# Patient Record
Sex: Male | Born: 1985 | Race: White | Hispanic: No | Marital: Single | Smoking: Never smoker
Health system: Southern US, Community
[De-identification: ages and names within clinical notes are randomized; demographics above are authoritative.]

## PROBLEM LIST (undated history)

## (undated) DIAGNOSIS — N200 Calculus of kidney: Secondary | ICD-10-CM

## (undated) DIAGNOSIS — F419 Anxiety disorder, unspecified: Secondary | ICD-10-CM

## (undated) DIAGNOSIS — F329 Major depressive disorder, single episode, unspecified: Secondary | ICD-10-CM

## (undated) DIAGNOSIS — F32A Depression, unspecified: Secondary | ICD-10-CM

---
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
## 2007-09-17 ENCOUNTER — Inpatient Hospital Stay (HOSPITAL_COMMUNITY): Admission: EM | Admit: 2007-09-17 | Discharge: 2007-09-20 | Payer: Self-pay | Admitting: *Deleted

## 2007-09-20 ENCOUNTER — Ambulatory Visit: Payer: Self-pay | Admitting: *Deleted

## 2008-08-21 ENCOUNTER — Emergency Department (HOSPITAL_COMMUNITY): Admission: EM | Admit: 2008-08-21 | Discharge: 2008-08-21 | Payer: Self-pay | Admitting: Emergency Medicine

## 2009-09-21 ENCOUNTER — Emergency Department (HOSPITAL_COMMUNITY): Admission: EM | Admit: 2009-09-21 | Discharge: 2009-09-21 | Payer: Self-pay | Admitting: Emergency Medicine

## 2010-02-06 ENCOUNTER — Ambulatory Visit: Payer: Self-pay | Admitting: Internal Medicine

## 2010-02-06 ENCOUNTER — Encounter (INDEPENDENT_AMBULATORY_CARE_PROVIDER_SITE_OTHER): Payer: Self-pay | Admitting: Family Medicine

## 2010-02-06 LAB — CONVERTED CEMR LAB
ALT: 64 units/L — ABNORMAL HIGH (ref 0–53)
AST: 26 units/L (ref 0–37)
Basophils Absolute: 0 10*3/uL (ref 0.0–0.1)
Basophils Relative: 1 % (ref 0–1)
CO2: 27 meq/L (ref 19–32)
Calcium: 10.1 mg/dL (ref 8.4–10.5)
Chloride: 102 meq/L (ref 96–112)
Cholesterol: 194 mg/dL (ref 0–200)
HCV Ab: NEGATIVE
Hemoglobin: 14.6 g/dL (ref 13.0–17.0)
Hep A Total Ab: NEGATIVE
Hep B Core Total Ab: NEGATIVE
Hep B S Ab: NEGATIVE
Lymphocytes Relative: 29 % (ref 12–46)
MCHC: 32.4 g/dL (ref 30.0–36.0)
Neutro Abs: 2.5 10*3/uL (ref 1.7–7.7)
Neutrophils Relative %: 60 % (ref 43–77)
Platelets: 221 10*3/uL (ref 150–400)
Potassium: 3.9 meq/L (ref 3.5–5.3)
RDW: 12.3 % (ref 11.5–15.5)
Sodium: 141 meq/L (ref 135–145)
Total CHOL/HDL Ratio: 5.2
Total Protein: 7.4 g/dL (ref 6.0–8.3)

## 2010-03-27 ENCOUNTER — Emergency Department (HOSPITAL_COMMUNITY): Admission: EM | Admit: 2010-03-27 | Discharge: 2010-03-27 | Payer: Self-pay | Admitting: Emergency Medicine

## 2010-09-05 LAB — URINALYSIS, ROUTINE W REFLEX MICROSCOPIC
Bilirubin Urine: NEGATIVE
Ketones, ur: NEGATIVE mg/dL
Nitrite: NEGATIVE
Specific Gravity, Urine: 1.018 (ref 1.005–1.030)
Urobilinogen, UA: 0.2 mg/dL (ref 0.0–1.0)
pH: 7 (ref 5.0–8.0)

## 2010-09-05 LAB — DIFFERENTIAL
Eosinophils Absolute: 0.3 10*3/uL (ref 0.0–0.7)
Eosinophils Relative: 4 % (ref 0–5)
Lymphocytes Relative: 19 % (ref 12–46)
Lymphs Abs: 1.1 10*3/uL (ref 0.7–4.0)
Monocytes Absolute: 0.3 10*3/uL (ref 0.1–1.0)
Monocytes Relative: 6 % (ref 3–12)

## 2010-09-05 LAB — COMPREHENSIVE METABOLIC PANEL
ALT: 19 U/L (ref 0–53)
AST: 19 U/L (ref 0–37)
Albumin: 4.1 g/dL (ref 3.5–5.2)
CO2: 28 mEq/L (ref 19–32)
Calcium: 9.1 mg/dL (ref 8.4–10.5)
Creatinine, Ser: 0.91 mg/dL (ref 0.4–1.5)
GFR calc Af Amer: >60 mL/min (ref >60)
Sodium: 142 mEq/L (ref 135–145)
Total Protein: 6.6 g/dL (ref 6.0–8.3)

## 2010-09-05 LAB — CBC
Hemoglobin: 14 g/dL (ref 13.0–17.0)
MCH: 31.1 pg (ref 26.0–34.0)
MCHC: 33.6 g/dL (ref 30.0–36.0)
Platelets: 198 10*3/uL (ref 150–400)
RBC: 4.51 MIL/uL (ref 4.22–5.81)

## 2010-09-08 ENCOUNTER — Emergency Department (HOSPITAL_COMMUNITY)
Admission: EM | Admit: 2010-09-08 | Discharge: 2010-09-08 | Disposition: A | Discharge status: Home / Self Care | Payer: Self-pay | Attending: Emergency Medicine | Admitting: Emergency Medicine

## 2010-09-08 DIAGNOSIS — R109 Unspecified abdominal pain: Secondary | ICD-10-CM | POA: Insufficient documentation

## 2010-09-08 DIAGNOSIS — N201 Calculus of ureter: Secondary | ICD-10-CM | POA: Insufficient documentation

## 2010-09-08 DIAGNOSIS — Z87442 Personal history of urinary calculi: Secondary | ICD-10-CM | POA: Insufficient documentation

## 2010-09-08 LAB — URINALYSIS, ROUTINE W REFLEX MICROSCOPIC
Bilirubin Urine: NEGATIVE
Glucose, UA: NEGATIVE mg/dL
Ketones, ur: 15 mg/dL — AB
Leukocytes, UA: NEGATIVE
Protein, ur: NEGATIVE mg/dL
pH: 6.5 (ref 5.0–8.0)

## 2010-09-08 LAB — URINE MICROSCOPIC-ADD ON

## 2010-09-11 LAB — POCT I-STAT, CHEM 8
BUN: 11 mg/dL (ref 6–23)
Calcium, Ion: 1.14 mmol/L (ref 1.12–1.32)
Chloride: 106 mEq/L (ref 96–112)
Creatinine, Ser: 1 mg/dL (ref 0.4–1.5)
TCO2: 27 mmol/L (ref 0–100)

## 2010-09-11 LAB — URINALYSIS, ROUTINE W REFLEX MICROSCOPIC
Bilirubin Urine: NEGATIVE
Ketones, ur: NEGATIVE mg/dL
Leukocytes, UA: NEGATIVE
Nitrite: NEGATIVE
Protein, ur: NEGATIVE mg/dL
Urobilinogen, UA: 1 mg/dL (ref 0.0–1.0)

## 2010-09-14 ENCOUNTER — Emergency Department (HOSPITAL_COMMUNITY)
Admission: EM | Admit: 2010-09-14 | Discharge: 2010-09-14 | Disposition: A | Discharge status: Home / Self Care | Payer: Self-pay | Attending: Emergency Medicine | Admitting: Emergency Medicine

## 2010-09-14 ENCOUNTER — Emergency Department (HOSPITAL_COMMUNITY): Payer: Self-pay

## 2010-09-14 DIAGNOSIS — R109 Unspecified abdominal pain: Secondary | ICD-10-CM | POA: Insufficient documentation

## 2010-09-14 DIAGNOSIS — Z87442 Personal history of urinary calculi: Secondary | ICD-10-CM | POA: Insufficient documentation

## 2010-09-14 LAB — URINALYSIS, ROUTINE W REFLEX MICROSCOPIC
Glucose, UA: NEGATIVE mg/dL
Hgb urine dipstick: NEGATIVE
Ketones, ur: NEGATIVE mg/dL
Protein, ur: NEGATIVE mg/dL
pH: 6 (ref 5.0–8.0)

## 2010-09-14 LAB — POCT I-STAT, CHEM 8
BUN: 21 mg/dL (ref 6–23)
Calcium, Ion: 1.17 mmol/L (ref 1.12–1.32)
Chloride: 102 mEq/L (ref 96–112)
Creatinine, Ser: 1 mg/dL (ref 0.4–1.5)
Sodium: 138 mEq/L (ref 135–145)
TCO2: 24 mmol/L (ref 0–100)

## 2010-09-21 ENCOUNTER — Emergency Department (HOSPITAL_COMMUNITY)
Admission: EM | Admit: 2010-09-21 | Discharge: 2010-09-21 | Disposition: A | Discharge status: Home / Self Care | Payer: Self-pay | Attending: Emergency Medicine | Admitting: Emergency Medicine

## 2010-09-21 DIAGNOSIS — M545 Low back pain, unspecified: Secondary | ICD-10-CM | POA: Insufficient documentation

## 2010-09-21 DIAGNOSIS — Z87442 Personal history of urinary calculi: Secondary | ICD-10-CM | POA: Insufficient documentation

## 2010-09-21 DIAGNOSIS — R319 Hematuria, unspecified: Secondary | ICD-10-CM | POA: Insufficient documentation

## 2010-09-21 LAB — URINALYSIS, ROUTINE W REFLEX MICROSCOPIC
Glucose, UA: NEGATIVE mg/dL
Leukocytes, UA: NEGATIVE
pH: 7.5 (ref 5.0–8.0)

## 2010-09-21 LAB — URINE MICROSCOPIC-ADD ON

## 2010-09-23 LAB — URINE CULTURE

## 2010-09-24 ENCOUNTER — Emergency Department (HOSPITAL_COMMUNITY): Payer: Self-pay

## 2010-09-24 ENCOUNTER — Emergency Department (HOSPITAL_COMMUNITY)
Admission: EM | Admit: 2010-09-24 | Discharge: 2010-09-24 | Disposition: A | Discharge status: Home / Self Care | Payer: Self-pay | Attending: Emergency Medicine | Admitting: Emergency Medicine

## 2010-09-24 DIAGNOSIS — R109 Unspecified abdominal pain: Secondary | ICD-10-CM | POA: Insufficient documentation

## 2010-09-24 DIAGNOSIS — N200 Calculus of kidney: Secondary | ICD-10-CM | POA: Insufficient documentation

## 2010-09-24 DIAGNOSIS — R319 Hematuria, unspecified: Secondary | ICD-10-CM | POA: Insufficient documentation

## 2010-09-24 DIAGNOSIS — N509 Disorder of male genital organs, unspecified: Secondary | ICD-10-CM | POA: Insufficient documentation

## 2010-09-24 LAB — BASIC METABOLIC PANEL
Chloride: 103 mEq/L (ref 96–112)
GFR calc non Af Amer: >60 mL/min (ref >60)
Glucose, Bld: 92 mg/dL (ref 70–99)
Potassium: 3.5 mEq/L (ref 3.5–5.1)
Sodium: 140 mEq/L (ref 135–145)

## 2010-09-24 LAB — URINALYSIS, ROUTINE W REFLEX MICROSCOPIC
Bilirubin Urine: NEGATIVE
Glucose, UA: NEGATIVE mg/dL
Specific Gravity, Urine: 1.026 (ref 1.005–1.030)
Urobilinogen, UA: 0.2 mg/dL (ref 0.0–1.0)
pH: 6 (ref 5.0–8.0)

## 2010-09-24 LAB — DIFFERENTIAL
Basophils Absolute: 0 10*3/uL (ref 0.0–0.1)
Eosinophils Relative: 3 % (ref 0–5)
Lymphocytes Relative: 30 % (ref 12–46)
Neutro Abs: 3 10*3/uL (ref 1.7–7.7)
Neutrophils Relative %: 59 % (ref 43–77)

## 2010-09-24 LAB — URINE MICROSCOPIC-ADD ON

## 2010-09-24 LAB — CBC
HCT: 41.2 % (ref 39.0–52.0)
RBC: 4.57 MIL/uL (ref 4.22–5.81)
RDW: 11.3 % — ABNORMAL LOW (ref 11.5–15.5)
WBC: 5 10*3/uL (ref 4.0–10.5)

## 2010-11-05 NOTE — H&P (Signed)
NAME:  Ethan Peck, Ethan Peck NO.:  [id number]   MEDICAL RECORD NO.:  [id number]          PATIENT TYPE:  IPS   LOCATION:  0301                          FACILITY:  [facility]   PHYSICIAN:  Jasmine Pang, M.D. DATE OF BIRTH:  1985-07-18   DATE OF ADMISSION:  09/17/2007  DATE OF DISCHARGE:                       PSYCHIATRIC ADMISSION ASSESSMENT   IDENTIFYING INFORMATION/JUSTIFICATION FOR ADMISSION AND CARE:  This is a  25 year old single white male. He presented as a walk-in yesterday. He  stated that he was suicidal and depressed. He was actually brought to  the [facility] by his mother. He reported that he has been  having persistent suicidal ideation with a plan to overdose on alcohol  and pills or to shoot himself. He states he no longer wants to live and  he does not care if his death upsets his family. He reports multiple  stressors but primarily reports being harassed for being gay and having  no job and has financial issues. He was unable to contact for safety to  go home.   PAST PSYCHIATRIC HISTORY:  He states that he has attempted to start  counseling several times but it just has not worked out with anyone yet.  He denies any prior anti-depressant, anti-anxiety medications, although  he reports as a youngster, having been treated for ADD with Ritalin or  Adderall. He does not recall which exactly.   SOCIAL HISTORY:  He did finish high school. He lives with his mother. He  helps take care of his younger brother, who is in high school. The last  time he worked was approximately a year ago. 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   FAMILY HISTORY:  Denies alcohol and drug history.   SOCIAL HISTORY:  He denies any current usage, although he has used  marijuana in the past.   PRIMARY CARE PHYSICIAN:  Donia Guiles, M.D.   PAST MEDICAL HISTORY:  In the past 4 months, he has lost approximately  50 pounds. This was due to running and  exercising. He does not deny that  this was an intentional weight loss.   MEDICATIONS:  He is not on any.   ALLERGIES:  NO KNOWN DRUG ALLERGIES.   PHYSICAL EXAMINATION:  GENERAL:  A well developed, well nourished male  who appears his stated age of 33.  VITAL SIGNS:  Height 68 3/4 inches tall. Weight 157. Temperature 98.3.  Blood pressure 137/90 to 142/92. Pulse 54 to 78. Respiratory rate 16.  SKIN:  He has some stretch marks on his abdomen, which are consistent  with his weight loss. Other than that, he had no remarkable findings.   LABORATORY DATA:  Is being gotten. We have no results at this time.   MENTAL STATUS EXAM:  Alert and oriented. He was appropriately groomed,  dressed, and nourished. His speech is not pressured. His mood is  depressed. Affect is somewhat depressed as well. Thought processes are  clear, rational and goal oriented. He wants to get better. He realizes  that he needs to start medication and although he does  not want to be  around people, he was active in the male unit before we went to speak to  him. Judgment and insight are good. Concentration and memory  superficially at least, are intact, although he does report some issues  with it. Intelligence is at least average. He is still suicidal. He can  contract for safety while in the hospital. He does not have homicidal  ideation and he does not have auditory visual hallucinations.   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 He reports a prior history of ADD when younger and he also  reports having a              homosexual orientation.  AXIS II:    Deferred.  AXIS III:   None.  AXIS IV:    Severe. Financial, economic, and occupational issues.  AXIS V:     35.   PLAN:  1. Admit for safety and stabilization.  2. Will start medication toward that end, as he is self-pay and is not      under sponsorship. We started Celexa 10 mg a.m. and at bedtime. We      will get him  followup at [facility] for      medications and therapy.   ESTIMATED LENGTH OF STAY:  3 to 5 days.      Mickie Leonarda Salon, P.A.-C.      Jasmine Pang, M.D.  Electronically Signed    MD/MEDQ  D:  09/18/2007  T:  09/18/2007  Job:  [id number]

## 2010-11-08 NOTE — Discharge Summary (Signed)
NAME:  Ethan Peck, Ethan Peck NO.:  [id number]   MEDICAL RECORD NO.:  [id number]          PATIENT TYPE:  IPS   LOCATION:  0301                          FACILITY:  [facility]   PHYSICIAN:  Jasmine Pang, M.D. DATE OF BIRTH:  [birth year]   DATE OF ADMISSION:  09/17/2007  DATE OF DISCHARGE:  09/20/2007                               DISCHARGE SUMMARY   IDENTIFYING INFORMATION:  This is a 25 year old single white male  presented as a walk-in and was admitted on a voluntary basis on September 17, 2007.   HISTORY OF PRESENT ILLNESS:  He stated that he was suicidal and  depressed.  He was actually brought to [facility] by his  mother.  He reported that he had been having persistent suicidal  ideation with a plan to overdose on alcohol and pills or to shoot  himself.  He states he no longer wants to live and he does not care of  his death of sepsis family.  He reports multiple stressors, but  primarily reports being harassed for being gay and having no job and has  financial issues.  He is unable to contract for safety to go home.   PAST PSYCHIATRIC HISTORY:  The patient states he has attempted to start  counseling several times, but it is not worked out with anyone yet.  He  denies any prior antidepressant, antianxiety medications so he reports  as a youngster having been treated for ADD with Ritalin and Adderall.  He does not recall which exactly.   FAMILY HISTORY:  Denies any history.   ALCOHOL AND DRUG USE:  The patient denies any alcohol or drug use  history.   PAST MEDICAL HISTORY:  None.   MEDICATIONS:  He is not on any.   ALLERGIES:  No known drug allergies.   PHYSICAL EXAM:  The patient was a well-developed, well-nourished male  who appeared his stated age of 3, who was in no acute distress.  There  were no medical or physical problems.   HOSPITAL COURSE.:  Upon admission, the patient was placed on Ambien 10  mg p.o. q.h.s. p.r.n. insomnia.  He was  also started on Celexa 10 mg in  the morning and at bedtime.  The patient tolerated this medication well  with no significant side effects.  In individual sessions with me, he  was friendly and cooperative, fair eye contact.  He stated his suicidal  ideation had worsened and he had begun develop a plan to kill himself.  He reports a lot of depression and anxiety.  He states he lives with his  mother.  He was tolerating the Celexa well.  He participated  appropriately in unit therapeutic groups and activities.  As  hospitalization progressed, he became less depressed and less anxious.  His family came to visit and they were very supportive.  He felt  encouraged by this.  He discussed being picked on because he is gay.  He  continued had no side effects to the Celexa.  On September 20, 2007, mental  status had improved markedly from admission status.  Sleep was good.  Appetite was good.  Mood was less depressed, less anxious.  Affect  consistent with mood.  There was no suicidal or homicidal ideation.  No  thoughts of self-injurious behavior.  No auditory or visual  hallucinations.  No paranoia or delusions.  Thoughts were logical and  goal-directed.  Thought content, no predominant theme.  Cognitive was  grossly back to baseline.  The patient felt ready for discharge and his  family felt ready to have him home.   DISCHARGE DIAGNOSES:  Axis I:  Major depressive disorder, single, severe  without psychotic features.  Attention deficit hyperactivity disorder,  not otherwise specified.  Axis II:  None.  Axis III:  None.  Axis IV:  Severe (financial, economic, and occupational issues, burden  of psychiatric illness, psychosocial problems).  Axis V:  Global assessment of functioning was 50 upon discharge.  GAF  was 35 upon admission.  GAF highest past year was 60-65.   DISCHARGE PLANS:  There was no specific activity level or dietary  restrictions.   POSTHOSPITAL CARE PLANS:  [name] by Dr. Lang Snow on April 16 at 3:15 p.m..   DISCHARGE MEDICATIONS:  1. Celexa 20 mg daily.  2. Ambien 10 mg at bedtime.      Jasmine Pang, M.D.  Electronically Signed     BHS/MEDQ  D:  10/18/2007  T:  10/18/2007  Job:  [id number]

## 2011-03-17 LAB — DRUGS OF ABUSE SCREEN W/O ALC, ROUTINE URINE
Amphetamine Screen, Ur: NEGATIVE
Barbiturate Quant, Ur: NEGATIVE
Creatinine,U: 136.3
Methadone: NEGATIVE
Phencyclidine (PCP): NEGATIVE

## 2011-03-17 LAB — TSH: TSH: 1.134

## 2011-03-17 LAB — COMPREHENSIVE METABOLIC PANEL
Alkaline Phosphatase: 53
BUN: 10
Chloride: 103
Glucose, Bld: 103 — ABNORMAL HIGH
Potassium: 4.1
Total Bilirubin: 1

## 2011-03-17 LAB — URINALYSIS, ROUTINE W REFLEX MICROSCOPIC
Bilirubin Urine: NEGATIVE
Glucose, UA: NEGATIVE
Ketones, ur: NEGATIVE
pH: 6.5

## 2011-03-17 LAB — CBC
HCT: 44.5
Hemoglobin: 15.7
MCV: 90.1
RDW: 12.3
WBC: 7.1

## 2011-03-17 LAB — HIV ANTIBODY (ROUTINE TESTING W REFLEX): HIV: NONREACTIVE

## 2011-03-17 LAB — BENZODIAZEPINE, QUANTITATIVE, URINE: Alprazolam (GC/LC/MS), ur confirm: 170 ng/mL

## 2011-09-13 ENCOUNTER — Encounter (HOSPITAL_COMMUNITY): Payer: Self-pay | Admitting: Emergency Medicine

## 2011-09-13 ENCOUNTER — Emergency Department (HOSPITAL_COMMUNITY): Payer: Self-pay

## 2011-09-13 ENCOUNTER — Emergency Department (HOSPITAL_COMMUNITY)
Admission: EM | Admit: 2011-09-13 | Discharge: 2011-09-13 | Disposition: A | Discharge status: Home / Self Care | Payer: Self-pay | Attending: Emergency Medicine | Admitting: Emergency Medicine

## 2011-09-13 DIAGNOSIS — R109 Unspecified abdominal pain: Secondary | ICD-10-CM | POA: Insufficient documentation

## 2011-09-13 DIAGNOSIS — N390 Urinary tract infection, site not specified: Secondary | ICD-10-CM | POA: Insufficient documentation

## 2011-09-13 DIAGNOSIS — R112 Nausea with vomiting, unspecified: Secondary | ICD-10-CM | POA: Insufficient documentation

## 2011-09-13 DIAGNOSIS — N200 Calculus of kidney: Secondary | ICD-10-CM | POA: Insufficient documentation

## 2011-09-13 HISTORY — DX: Calculus of kidney: N20.0

## 2011-09-13 LAB — BASIC METABOLIC PANEL
CO2: 27 mEq/L (ref 19–32)
Calcium: 9.4 mg/dL (ref 8.4–10.5)
Creatinine, Ser: 0.79 mg/dL (ref 0.50–1.35)
GFR calc Af Amer: >90 mL/min (ref >90)
Sodium: 141 mEq/L (ref 135–145)

## 2011-09-13 LAB — URINALYSIS, ROUTINE W REFLEX MICROSCOPIC
Glucose, UA: NEGATIVE mg/dL
Nitrite: NEGATIVE
Protein, ur: NEGATIVE mg/dL
pH: 6.5 (ref 5.0–8.0)

## 2011-09-13 LAB — URINE MICROSCOPIC-ADD ON

## 2011-09-13 MED ORDER — MORPHINE SULFATE 4 MG/ML IJ SOLN
4.0000 mg | Freq: Once | INTRAMUSCULAR | Status: AC
  Administered 2011-09-13: 4 mg via INTRAVENOUS
  Filled 2011-09-13: qty 1

## 2011-09-13 MED ORDER — ONDANSETRON 8 MG PO TBDP: 8.0000 mg | ORAL_TABLET | Freq: Three times a day (TID) | ORAL | Status: AC | PRN

## 2011-09-13 MED ORDER — TAMSULOSIN HCL 0.4 MG PO CAPS: 0.4000 mg | ORAL_CAPSULE | Freq: Every day | ORAL | Status: DC

## 2011-09-13 MED ORDER — OXYCODONE-ACETAMINOPHEN 5-325 MG PO TABS: 2.0000 | ORAL_TABLET | Freq: Four times a day (QID) | ORAL | Status: AC | PRN

## 2011-09-13 MED ORDER — CEPHALEXIN 250 MG PO CAPS
500.0000 mg | ORAL_CAPSULE | Freq: Once | ORAL | Status: AC
  Administered 2011-09-13: 500 mg via ORAL
  Filled 2011-09-13: qty 2

## 2011-09-13 MED ORDER — ONDANSETRON HCL 4 MG/2ML IJ SOLN
4.0000 mg | Freq: Once | INTRAMUSCULAR | Status: AC
  Administered 2011-09-13: 4 mg via INTRAVENOUS
  Filled 2011-09-13: qty 2

## 2011-09-13 MED ORDER — CEPHALEXIN 500 MG PO CAPS: 500.0000 mg | ORAL_CAPSULE | Freq: Four times a day (QID) | ORAL | Status: AC

## 2011-09-13 MED ORDER — SODIUM CHLORIDE 0.9 % IV BOLUS (SEPSIS)
1000.0000 mL | Freq: Once | INTRAVENOUS | Status: AC
  Administered 2011-09-13: 1000 mL via INTRAVENOUS

## 2011-09-13 NOTE — Discharge Instructions (Signed)
Kidney Stones  Kidney stones (ureteral lithiasis) are deposits that form inside your kidneys. The intense pain is caused by the stone moving through the urinary tract. When the stone moves, the ureter goes into spasm around the stone. The stone is usually passed in the urine.   CAUSES    A disorder that makes certain neck glands produce too much parathyroid hormone (primary hyperparathyroidism).   A buildup of uric acid crystals.   Narrowing (stricture) of the ureter.   A kidney obstruction present at birth (congenital obstruction).   Previous surgery on the kidney or ureters.   Numerous kidney infections.  SYMPTOMS    Feeling sick to your stomach (nauseous).   Throwing up (vomiting).   Blood in the urine (hematuria).   Pain that usually spreads (radiates) to the groin.   Frequency or urgency of urination.  DIAGNOSIS    Taking a history and physical exam.   Blood or urine tests.   Computerized X-ray scan (CT scan).   Occasionally, an examination of the inside of the urinary bladder (cystoscopy) is performed.  TREATMENT    Observation.   Increasing your fluid intake.   Surgery may be needed if you have severe pain or persistent obstruction.  The size, location, and chemical composition are all important variables that will determine the proper choice of action for you. Talk to your caregiver to better understand your situation so that you will minimize the risk of injury to yourself and your kidney.   HOME CARE INSTRUCTIONS    Drink enough water and fluids to keep your urine clear or pale yellow.   Strain all urine through the provided strainer. Keep all particulate matter and stones for your caregiver to see. The stone causing the pain may be as small as a grain of salt. It is very important to use the strainer each and every time you pass your urine. The collection of your stone will allow your caregiver to analyze it and verify that a stone has actually passed.   Only take over-the-counter or  prescription medicines for pain, discomfort, or fever as directed by your caregiver.   Make a follow-up appointment with your caregiver as directed.   Get follow-up X-rays if required. The absence of pain does not always mean that the stone has passed. It may have only stopped moving. If the urine remains completely obstructed, it can cause loss of kidney function or even complete destruction of the kidney. It is your responsibility to make sure X-rays and follow-ups are completed. Ultrasounds of the kidney can show blockages and the status of the kidney. Ultrasounds are not associated with any radiation and can be performed easily in a matter of minutes.  SEEK IMMEDIATE MEDICAL CARE IF:    Pain cannot be controlled with the prescribed medicine.   You have a fever.   The severity or intensity of pain increases over 18 hours and is not relieved by pain medicine.   You develop a new onset of abdominal pain.   You feel faint or pass out.  MAKE SURE YOU:    Understand these instructions.   Will watch your condition.   Will get help right away if you are not doing well or get worse.  Document Released: 06/09/2005 Document Revised: 05/29/2011 Document Reviewed: 10/05/2009  ExitCare Patient Information 2012 [employer].    Urinary Tract Infection  Infections of the urinary tract can start in several places. A bladder infection (cystitis), a kidney infection (pyelonephritis), and a   prostate infection (prostatitis) are different types of urinary tract infections (UTIs). They usually get better if treated with medicines (antibiotics) that kill germs. Take all the medicine until it is gone. You or your child may feel better in a few days, but TAKE ALL MEDICINE or the infection may not respond and may become more difficult to treat.  HOME CARE INSTRUCTIONS    Drink enough water and fluids to keep the urine clear or pale yellow. Cranberry juice is especially recommended, in addition to large amounts of  water.   Avoid caffeine, tea, and carbonated beverages. They tend to irritate the bladder.   Alcohol may irritate the prostate.   Only take over-the-counter or prescription medicines for pain, discomfort, or fever as directed by your caregiver.  To prevent further infections:   Empty the bladder often. Avoid holding urine for long periods of time.   After a bowel movement, women should cleanse from front to back. Use each tissue only once.   Empty the bladder before and after sexual intercourse.  FINDING OUT THE RESULTS OF YOUR TEST  Not all test results are available during your visit. If your or your child's test results are not back during the visit, make an appointment with your caregiver to find out the results. Do not assume everything is normal if you have not heard from your caregiver or the medical facility. It is important for you to follow up on all test results.  SEEK MEDICAL CARE IF:    There is back pain.   Your baby is older than 3 months with a rectal temperature of 100.5 F (38.1 C) or higher for more than 1 day.   Your or your child's problems (symptoms) are no better in 3 days. Return sooner if you or your child is getting worse.  SEEK IMMEDIATE MEDICAL CARE IF:    There is severe back pain or lower abdominal pain.   You or your child develops chills.   You have a fever.   Your baby is older than 3 months with a rectal temperature of 102 F (38.9 C) or higher.   Your baby is 3 months old or younger with a rectal temperature of 100.4 F (38 C) or higher.   There is nausea or vomiting.   There is continued burning or discomfort with urination.  MAKE SURE YOU:    Understand these instructions.   Will watch your condition.   Will get help right away if you are not doing well or get worse.  Document Released: 03/19/2005 Document Revised: 05/29/2011 Document Reviewed: 10/22/2006  [employer] Patient Information 2012 [employer].

## 2011-09-13 NOTE — ED Provider Notes (Signed)
History     CSN: [id number]  Arrival date & time 09/13/11  1530   First MD Initiated Contact with Patient 09/13/11 1633      Chief Complaint  Patient presents with  . Flank Pain    (Consider location/radiation/quality/duration/timing/severity/associated sxs/prior treatment) HPI Patient is a 26 year old male who presents today complaining of having stabbing pain in his left flank and radiating down to his left lower quadrant that is similar to prior kidney stones. This began about 4 days ago. He's had some nausea and vomiting. Patient denies fevers. He denies any blood in his urine at this time. Patient has had nausea and one episode of vomiting. Patient has never required surgical intervention for kidney stones. He denies any penile discharge or penile pain. Patient denies any testicular pain as well. There are no other associated or modifying factors.  Past Medical History  Diagnosis Date  . Kidney stone     History reviewed. No pertinent past surgical history.  History reviewed. No pertinent family history.  History  Substance Use Topics  . Smoking status: Never Smoker   . Smokeless tobacco: Not on file  . Alcohol Use: Yes     on occasion      Review of Systems  Constitutional: Negative.   HENT: Negative.   Eyes: Negative.   Respiratory: Negative.   Cardiovascular: Negative.   Gastrointestinal: Positive for nausea, vomiting and abdominal pain.  Genitourinary: Positive for flank pain and difficulty urinating. Negative for dysuria, urgency, hematuria, discharge, penile swelling, scrotal swelling, penile pain and testicular pain.  Musculoskeletal: Negative.   Skin: Negative.   Neurological: Negative.   Hematological: Negative.   Psychiatric/Behavioral: Negative.   All other systems reviewed and are negative.    Allergies  Review of patient's allergies indicates no known allergies.  Home Medications   Current Outpatient Rx  Name Route Sig Dispense Refill  .  CEPHALEXIN 500 MG PO CAPS Oral Take 1 capsule (500 mg total) by mouth 4 (four) times daily. 40 capsule 0  . ONDANSETRON 8 MG PO TBDP Oral Take 1 tablet (8 mg total) by mouth every 8 (eight) hours as needed for nausea. 20 tablet 0  . OXYCODONE-ACETAMINOPHEN 5-325 MG PO TABS Oral Take 2 tablets by mouth every 6 (six) hours as needed for pain. 30 tablet 0  . TAMSULOSIN HCL 0.4 MG PO CAPS Oral Take 1 capsule (0.4 mg total) by mouth daily. 30 capsule 0    BP 118/64  Pulse 60  Temp(Src) 98.1 F (36.7 C) (Oral)  Resp 20  SpO2 98%  Physical Exam  Nursing note and vitals reviewed. GEN: Well-developed, well-nourished male in no distress, uncomfortable appearing HEENT: Atraumatic, normocephalic. Oropharynx clear without erythema EYES: PERRLA BL, no scleral icterus. NECK: Trachea midline, no meningismus CV: regular rate and rhythm. No murmurs, rubs, or gallops PULM: No respiratory distress.  No crackles, wheezes, or rales. GI: soft, tender to palpation over the left lower quadrant. No guarding, rebound. + bowel sounds  GU: deferred Neuro: cranial nerves 2-12 intact, no abnormalities of strength or sensation, A and O x 3 MSK: Patient moves all 4 extremities symmetrically, no deformity, edema, or injury noted Skin: No rashes petechiae, purpura, or jaundice Psych: no abnormality of mood   ED Course  Procedures (including critical care time)  Labs Reviewed  URINALYSIS, ROUTINE W REFLEX MICROSCOPIC - Abnormal; Notable for the following:    APPearance CLOUDY (*)    Leukocytes, UA LARGE (*)    All other components within  normal limits  BASIC METABOLIC PANEL - Abnormal; Notable for the following:    Potassium 3.4 (*)    All other components within normal limits  URINE MICROSCOPIC-ADD ON  URINE CULTURE   Ct Abdomen Pelvis Wo Contrast  09/13/2011  *RADIOLOGY REPORT*  Clinical Data: Left flank and groin pain beginning 2 days ago. History urinary tract stone.  CT ABDOMEN AND PELVIS WITHOUT  CONTRAST  Technique:  Multidetector CT imaging of the abdomen and pelvis was performed following the standard protocol without intravenous contrast.  Comparison: CT abdomen pelvis 09/24/2010.  Findings: There is an unchanged 1-2 mm subpleural nodule along the minor fissure.  Dependent atelectasis is noted.  No pleural or pericardial effusion.  There are no ureteral stones or hydronephrosis.  A punctate nonobstructing stone is identified in the mid pole of the right kidney. There is haziness of fat in the root of the mesentery where multiple small lymph nodes are identified.  Finding was present on the prior examination and does not appear notably changed.  The gallbladder, liver, spleen, adrenal glands and pancreas appear normal.  There is no fluid collection.  Urinary bladder, seminal vesicles and prostate gland are unremarkable.  The stomach, small and large bowel and appendix appear normal.  There is no focal bony abnormality.  IMPRESSION:  1.  Single punctate nonobstructing stone is identified in the right kidney.  There are no other urinary tract stones and no hydronephrosis. 2.  Haziness of the root of the mesentery where multiple small lymph nodes are identified does not appear markedly changed. Finding may be due to mesenteric adenitis.  Original Report Authenticated By: Bernadene Bell. D'ALESSIO, M.D.     1. Nephrolithiasis   2. UTI (urinary tract infection)       MDM  Patient was evaluated by myself. Based on presentation patient had a basic metabolic panel, urinalysis, and CT of abdomen and pelvis without contrast ordered. Urinalysis was remarkable for elevated white blood cells. Patient was nitrate negative. A urine culture was sent. Patient had no compromise of his renal function on metabolic panel. CT did show a punctate stone on the right but nothing on the left. Patient has no prior history of urinary tract infection. Patient had discussed the possibility of urethritis from a sexual transmitted  disease. Patient has had protected sex every time per his report he denies any penile discharge or penile pain. He has no scrotal pain patient declined testing for STI today. Patient also preferred to be treated for UTI initially. I recommended followup with a urologist. I do think patient also has symptoms from the past of a kidney stone given that he has always had very small stones in the past. Though this seems less likely. Patient was discharged with prescription for Flomax, Percocet, Zofran, and Keflex. He was given his first dose of Keflex in the emergency department. Patient was able to tolerate by mouth and was discharged home in good condition.        Cyndra Numbers, MD 09/13/11 [phone]

## 2011-09-13 NOTE — ED Notes (Addendum)
Per pt started having pain 2 days ago described as stabbing, in L hip to groin area and L flank, reports "feels like passing kidney stone" d/t history of kidney and reports this pain is similar to pain he has had before with stones; pt denies blood in urine, reports dysuria, and difficulty starting to urinate; pt a&OX4 and in NAD

## 2011-09-14 LAB — URINE CULTURE: Culture  Setup Time: 201303240151

## 2012-01-12 ENCOUNTER — Emergency Department (HOSPITAL_COMMUNITY)
Admission: EM | Admit: 2012-01-12 | Discharge: 2012-01-13 | Disposition: A | Discharge status: Home Health | Payer: Self-pay | Attending: Emergency Medicine | Admitting: Emergency Medicine

## 2012-01-12 DIAGNOSIS — M549 Dorsalgia, unspecified: Secondary | ICD-10-CM | POA: Insufficient documentation

## 2012-01-12 DIAGNOSIS — R109 Unspecified abdominal pain: Secondary | ICD-10-CM | POA: Insufficient documentation

## 2012-01-12 NOTE — ED Notes (Signed)
Pt reports hx kidney stones.  Pt report pain started a week ago and began toworsen to right side abd and radiating to right lower back.  Pt reports n/v.  Pt denies chills and fever.

## 2012-01-13 LAB — CBC WITH DIFFERENTIAL/PLATELET
Basophils Absolute: 0 10*3/uL (ref 0.0–0.1)
Eosinophils Absolute: 0.3 10*3/uL (ref 0.0–0.7)
Eosinophils Relative: 4 % (ref 0–5)
MCH: 30.1 pg (ref 26.0–34.0)
MCV: 89.9 fL (ref 78.0–100.0)
Platelets: 217 10*3/uL (ref 150–400)
RDW: 12.4 % (ref 11.5–15.5)

## 2012-01-13 LAB — URINALYSIS, ROUTINE W REFLEX MICROSCOPIC
Bilirubin Urine: NEGATIVE
Hgb urine dipstick: NEGATIVE
Protein, ur: NEGATIVE mg/dL
Urobilinogen, UA: 1 mg/dL (ref 0.0–1.0)

## 2012-01-13 LAB — BASIC METABOLIC PANEL
BUN: 14 mg/dL (ref 6–23)
Chloride: 106 mEq/L (ref 96–112)
Creatinine, Ser: 1.14 mg/dL (ref 0.50–1.35)
GFR calc Af Amer: >90 mL/min (ref >90)
GFR calc non Af Amer: 89 mL/min — ABNORMAL LOW (ref >90)

## 2012-01-13 MED ORDER — DICYCLOMINE HCL 10 MG PO CAPS
10.0000 mg | ORAL_CAPSULE | Freq: Once | ORAL | Status: AC
  Administered 2012-01-13: 10 mg via ORAL
  Filled 2012-01-13: qty 1

## 2012-01-13 MED ORDER — SODIUM CHLORIDE 0.9 % IV BOLUS (SEPSIS)
1000.0000 mL | Freq: Once | INTRAVENOUS | Status: AC
  Administered 2012-01-13: 1000 mL via INTRAVENOUS

## 2012-01-13 MED ORDER — KETOROLAC TROMETHAMINE 30 MG/ML IJ SOLN
30.0000 mg | Freq: Once | INTRAMUSCULAR | Status: AC
  Administered 2012-01-13: 30 mg via INTRAVENOUS
  Filled 2012-01-13: qty 1

## 2012-01-13 MED ORDER — ONDANSETRON HCL 4 MG/2ML IJ SOLN
4.0000 mg | Freq: Once | INTRAMUSCULAR | Status: AC
  Administered 2012-01-13: 4 mg via INTRAVENOUS
  Filled 2012-01-13: qty 2

## 2012-01-13 MED ORDER — DICYCLOMINE HCL 20 MG PO TABS: 20.0000 mg | ORAL_TABLET | Freq: Four times a day (QID) | ORAL | Status: DC | PRN

## 2012-01-13 NOTE — ED Notes (Signed)
Pt with HR of 40. MD informed. Pt stated to walk pt around unit. Pt ambulated around unit. Pt w/o any s/s of distress or pain. Pt tolerated well. Pt with HR of 46. MD informed. Will continue to monitor.

## 2012-01-13 NOTE — ED Notes (Signed)
Pt instructed needs to void.

## 2012-01-13 NOTE — ED Provider Notes (Signed)
History     CSN: [id number]  Arrival date & time 01/12/12  2341   First MD Initiated Contact with Patient 01/13/12 0018      Chief Complaint  Patient presents with  . Abdominal Pain  . Back Pain    (Consider location/radiation/quality/duration/timing/severity/associated sxs/prior treatment) HPI 26 year old male presents to emergency room complaining of one week of ongoing suprapubic pain with radiation into bilateral flanks. Patient feels like he is having another kidney stone. Patient reports history of kidney stone in March. No fever no chills, no nausea or vomiting.  No penile discharge or dysuria.  Patient has not taken anything for the pain. Pt is having normal bowel movements.       Past Medical History  Diagnosis Date  . Kidney stone     No past surgical history on file.  No family history on file.  History  Substance Use Topics  . Smoking status: Never Smoker   . Smokeless tobacco: Not on file  . Alcohol Use: Yes     on occasion      Review of Systems  All other systems reviewed and are negative.    Allergies  Review of patient's allergies indicates no known allergies.  Home Medications   Current Outpatient Rx  Name Route Sig Dispense Refill  . DICYCLOMINE HCL 20 MG PO TABS Oral Take 1 tablet (20 mg total) by mouth every 6 (six) hours as needed (for abdominal cramping and pain). 20 tablet 0    BP 114/65  Pulse 40  Temp 98 F (36.7 C) (Oral)  Resp 16  SpO2 98%  Physical Exam  Nursing note and vitals reviewed. Constitutional: He is oriented to person, place, and time. He appears well-developed and well-nourished.  HENT:  Head: Normocephalic and atraumatic.  Nose: Nose normal.  Mouth/Throat: Oropharynx is clear and moist.  Eyes: Conjunctivae and EOM are normal. Pupils are equal, round, and reactive to light.  Neck: Normal range of motion. Neck supple. No JVD present. No tracheal deviation present. No thyromegaly present.  Cardiovascular:  Normal rate, regular rhythm, normal heart sounds and intact distal pulses.  Exam reveals no gallop and no friction rub.   No murmur heard. Pulmonary/Chest: Effort normal and breath sounds normal. No stridor. No respiratory distress. He has no wheezes. He has no rales. He exhibits no tenderness.  Abdominal: Soft. Bowel sounds are normal. He exhibits no distension and no mass. There is tenderness (pain in suprapubic area, mild bilateral cva tenderness). There is no rebound and no guarding.  Musculoskeletal: Normal range of motion. He exhibits no edema and no tenderness.  Lymphadenopathy:    He has no cervical adenopathy.  Neurological: He is alert and oriented to person, place, and time. He exhibits normal muscle tone. Coordination normal.  Skin: Skin is dry. No rash noted. No erythema. No pallor.  Psychiatric: He has a normal mood and affect. His behavior is normal. Judgment and thought content normal.    ED Course  Procedures (including critical care time)  Labs Reviewed  BASIC METABOLIC PANEL - Abnormal; Notable for the following:    GFR calc non Af Amer 89 (*)     All other components within normal limits  URINALYSIS, ROUTINE W REFLEX MICROSCOPIC  CBC WITH DIFFERENTIAL  GC/CHLAMYDIA PROBE AMP, URINE   No results found.   1. Abdominal pain       MDM  Workup unremarkable, no signs of stone or infection.  With bilateral flank pain, less likely to be stone.  Pt feeling better after toradol, zofran.  Will d/c with bentyl and resources for outpatient followup        Olivia Mackie, MD 01/13/12 [phone]

## 2012-01-14 LAB — GC/CHLAMYDIA PROBE AMP, URINE: GC Probe Amp, Urine: NEGATIVE

## 2012-02-27 ENCOUNTER — Emergency Department (HOSPITAL_COMMUNITY)
Admission: EM | Admit: 2012-02-27 | Discharge: 2012-02-27 | Disposition: A | Discharge status: Home / Self Care | Payer: Self-pay | Attending: Emergency Medicine | Admitting: Emergency Medicine

## 2012-02-27 ENCOUNTER — Encounter (HOSPITAL_COMMUNITY): Payer: Self-pay | Admitting: *Deleted

## 2012-02-27 DIAGNOSIS — F419 Anxiety disorder, unspecified: Secondary | ICD-10-CM

## 2012-02-27 DIAGNOSIS — F3289 Other specified depressive episodes: Secondary | ICD-10-CM | POA: Insufficient documentation

## 2012-02-27 DIAGNOSIS — F329 Major depressive disorder, single episode, unspecified: Secondary | ICD-10-CM | POA: Insufficient documentation

## 2012-02-27 DIAGNOSIS — Z9119 Patient's noncompliance with other medical treatment and regimen: Secondary | ICD-10-CM | POA: Insufficient documentation

## 2012-02-27 DIAGNOSIS — F411 Generalized anxiety disorder: Secondary | ICD-10-CM | POA: Insufficient documentation

## 2012-02-27 DIAGNOSIS — Z9114 Patient's other noncompliance with medication regimen: Secondary | ICD-10-CM

## 2012-02-27 DIAGNOSIS — Z91199 Patient's noncompliance with other medical treatment and regimen due to unspecified reason: Secondary | ICD-10-CM | POA: Insufficient documentation

## 2012-02-27 HISTORY — DX: Depression, unspecified: F32.A

## 2012-02-27 HISTORY — DX: Anxiety disorder, unspecified: F41.9

## 2012-02-27 HISTORY — DX: Major depressive disorder, single episode, unspecified: F32.9

## 2012-02-27 MED ORDER — LORAZEPAM 1 MG PO TABS
2.0000 mg | ORAL_TABLET | Freq: Once | ORAL | Status: AC
  Administered 2012-02-27: 2 mg via ORAL
  Filled 2012-02-27: qty 2

## 2012-02-27 NOTE — ED Notes (Signed)
Pt states off meds x 1 wk; all meds (trazadone, gabapentin, celexa, saphris) restarted yesterday; since then feeling anxious/unable to sleep/body aches

## 2012-02-27 NOTE — ED Provider Notes (Signed)
History     CSN: [id number]  Arrival date & time 02/27/12  0559   First MD Initiated Contact with Patient 02/27/12 [phone]      Chief Complaint  Patient presents with  . Anxiety    (Consider location/radiation/quality/duration/timing/severity/associated sxs/prior treatment) The history is provided by the patient.   the patient reports a long-standing history of depression bipolar disorder.  He reports he is currently off his medications for the past week secondary to a missed physician's appointment.  He restart his medications yesterday but reports feeling anxious and having difficulty sleeping as well as having myalgias over the past several days.  He took his normal dose of trazodone last night and reported that he was unable to sleep through the night.  This made him more anxious he presents the emergency department for evaluation and possible treatment.  He denies homicidal or suicidal thoughts.  He has no other complaints.  He denies self-medicating with alcohol/drugs.  Mother does not believe he is a threat to himself or to others.  They both agree that he does not need psychiatric hospitalization at this time.  He has not contacted his psychiatrist regarding his symptoms.  Past Medical History  Diagnosis Date  . Kidney stone   . Anxiety   . Depression     History reviewed. No pertinent past surgical history.  No family history on file.  History  Substance Use Topics  . Smoking status: Never Smoker   . Smokeless tobacco: Not on file  . Alcohol Use: Yes     on occasion      Review of Systems  All other systems reviewed and are negative.    Allergies  Review of patient's allergies indicates no known allergies.  Home Medications   Current Outpatient Rx  Name Route Sig Dispense Refill  . ASENAPINE MALEATE 10 MG SL SUBL Sublingual Place 1 tablet under the tongue 2 (two) times daily.    Marland Kitchen CITALOPRAM HYDROBROMIDE 20 MG PO TABS Oral Take 20 mg by mouth daily.    Marland Kitchen  GABAPENTIN 300 MG PO CAPS Oral Take 300 mg by mouth 4 (four) times daily as needed. For anxiety    . TRAZODONE HCL 50 MG PO TABS Oral Take 50 mg by mouth at bedtime.      BP 124/77  Pulse 57  Temp 97.7 F (36.5 C)  Resp 20  SpO2 100%  Physical Exam  Nursing note and vitals reviewed. Constitutional: He is oriented to person, place, and time. He appears well-developed and well-nourished.  HENT:  Head: Normocephalic and atraumatic.  Eyes: EOM are normal.  Neck: Normal range of motion.  Cardiovascular: Normal rate, regular rhythm, normal heart sounds and intact distal pulses.   Pulmonary/Chest: Effort normal and breath sounds normal. No respiratory distress.  Abdominal: Soft. He exhibits no distension. There is no tenderness.  Musculoskeletal: Normal range of motion.  Neurological: He is alert and oriented to person, place, and time.  Skin: Skin is warm and dry.  Psychiatric: He has a normal mood and affect. His behavior is normal. Judgment and thought content normal. His speech is not rapid and/or pressured. He is not aggressive and is not hyperactive. Cognition and memory are normal.    ED Course  Procedures (including critical care time)  Labs Reviewed - No data to display No results found.   1. Anxiety   2. History of medication noncompliance       MDM  The patient is well-appearing.  There is  no indication for psychiatric placement.  I suspect the majority of his symptoms are secondary to noncompliance with his medications over the past week.  He will take some time for his medication levels to rise and regained artifact.  I recommended that the patient double his trazodone dose at night for the next 2-3 nights.  He's been instructed to call his psychiatrist for followup in the next several days.  He understands return the emergency department for new or worsening symptoms.  His mother is present and understands the patient treatment plan the        Lyanne Co,  MD 02/27/12 [phone]

## 2012-06-21 IMAGING — CR DG ABDOMEN 1V
1 series · 1 of 1 positions shown · non-contrast
Comparison: CT of abdomen dated 03/27/2010

CLINICAL DATA: Right flank pain.

ABDOMEN - 1 VIEW

[t abdomen supine]
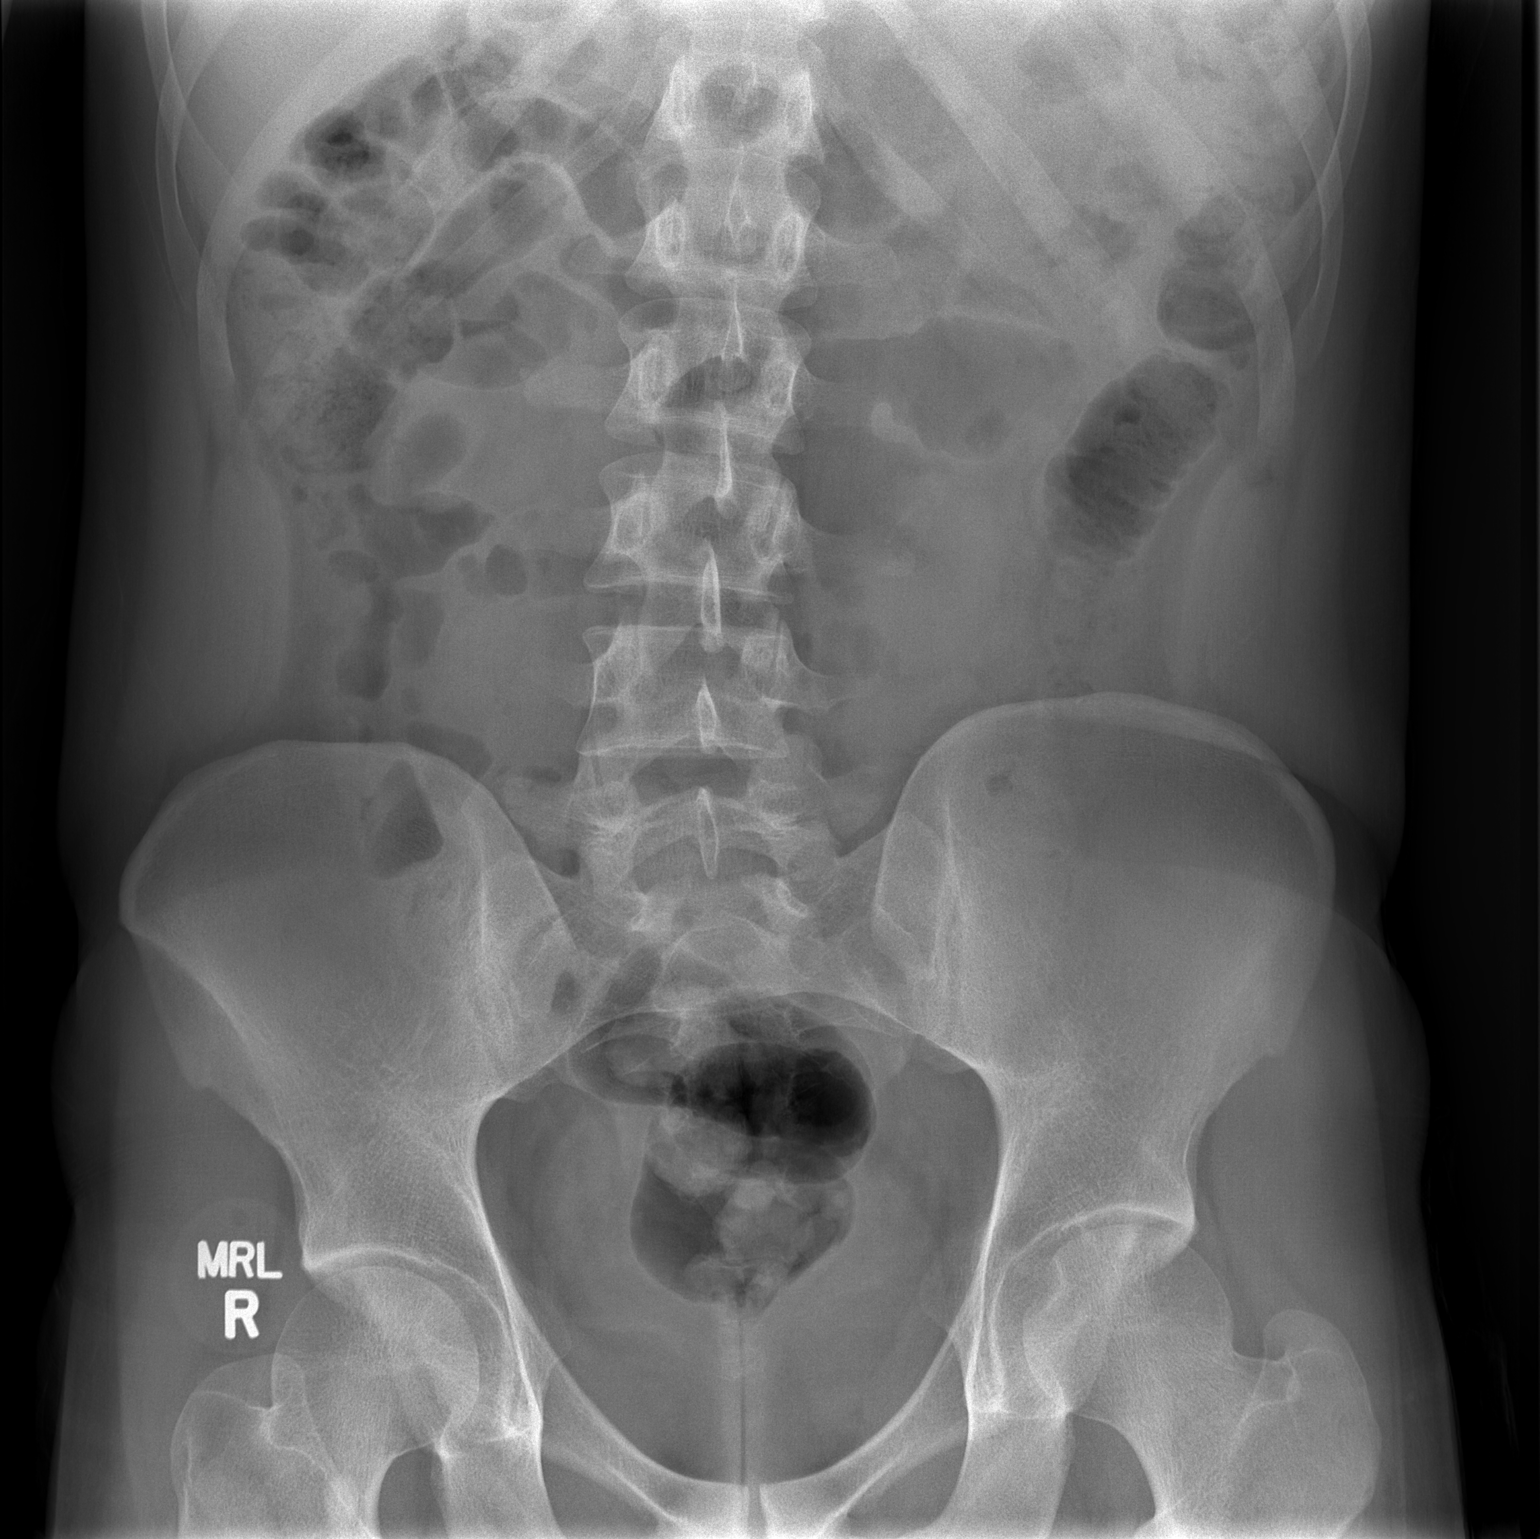

[1 of 1 positions shown; findings below may reference images not displayed]

FINDINGS: The bowel gas pattern is normal.  There is no evidence
of free air.  No radio-opaque calculi or other significant
radiographic abnormality is seen.
IMPRESSION: Negative.

## 2013-05-09 ENCOUNTER — Ambulatory Visit (INDEPENDENT_AMBULATORY_CARE_PROVIDER_SITE_OTHER): Payer: PRIVATE HEALTH INSURANCE | Admitting: Emergency Medicine

## 2013-05-09 ENCOUNTER — Encounter: Payer: Self-pay | Admitting: Emergency Medicine

## 2013-05-09 VITALS — BP 116/66 | HR 60 | Ht 71.0 in | Wt 180.0 lb

## 2013-05-09 DIAGNOSIS — F411 Generalized anxiety disorder: Secondary | ICD-10-CM

## 2013-05-09 DIAGNOSIS — F329 Major depressive disorder, single episode, unspecified: Secondary | ICD-10-CM

## 2013-05-09 DIAGNOSIS — Z23 Encounter for immunization: Secondary | ICD-10-CM

## 2013-05-09 DIAGNOSIS — Z0000 Encounter for general adult medical examination without abnormal findings: Secondary | ICD-10-CM

## 2013-05-09 DIAGNOSIS — F32A Depression, unspecified: Secondary | ICD-10-CM

## 2013-05-09 MED ORDER — BUSPIRONE HCL 7.5 MG PO TABS: 7.5000 mg | ORAL_TABLET | Freq: Two times a day (BID) | ORAL | Status: DC

## 2013-05-09 NOTE — Patient Instructions (Signed)
It was nice to meet you!  We are changing the medicine for your anxiety. Stop the gabapentin. Start buspar 1 pill twice a day every day.  Things to do to Keep yourself Healthy  - Exercise at least 30-45 minutes a day,  3-4 days a week.  - Eat a low-fat diet with lots of fruits and vegetables, up to 7-9 servings per day. - Seatbelts can save your life. Wear them always. - Smoke detectors on every level of your home, check batteries every year. - Eye Doctor - have an eye exam every 1-2 years - Safe sex - if you may be exposed to STDs, use a condom. - Alcohol If you drink, do it moderately,less than 2 drinks per dayt. - Health Care Power of Attorney.  Choose someone to speak for you if you are not able. - Depression is common in our stressful world.If you're feeling down or losing interest in things you normally enjoy, please come in for a visit.  Follow up in 2-4 weeks.

## 2013-05-09 NOTE — Assessment & Plan Note (Signed)
Suspect panic disorder rather than GAD. Discussed options of observation off medication vs changing to buspar. He opted for buspar.  Stop gabapentin. Start buspar 7.5mg  BID. F/u in 2-4 weeks.  Consider GAD scoring at that time and will obtain more history.

## 2013-05-09 NOTE — Assessment & Plan Note (Signed)
No reported history of bipolar. Not currently on medications.  PHQ-9: 2  (no SI/HI). Follow up prn.

## 2013-05-09 NOTE — Assessment & Plan Note (Signed)
Flu shot today 

## 2013-05-09 NOTE — Progress Notes (Signed)
  Subjective:    Patient ID: Ethan Peck, male    DOB: 02-12-1986, 27 y.o.   MRN: [id number]  HPI Ethan Peck is here for a NP appt with his adoptive mother.  I have reviewed and updated the following as appropriate: allergies, current medications, past family history, past medical history, past social history, past surgical history and problem list PMHx: depression and anxiety; kidney stones  FHx: drug abuse and depression SHx: non smoker; works as Social worker; see history section of EMR  Anxiety He states that he has been on multiple medications via [facility] for depression and anxiety, none of which were helpful and made he feel not himself.  He is still taking gabapentin prn for anxiety.  He does not like this medicine as it makes him dizzy and off balance.  He did have a syncopal episode after taking it once.  It is unclear how often he feels that he needs a medication for anxiety, but he takes the gabapentin 2-3 times a week.  He does not currently endorse any depression or SI/HI.  He does not want to return to [facility].  Health Maintenance: will get flu shot today  Review of Systems Patient Information Form: Screening and ROS  Do you feel safe in relationships? yes PHQ-2:positive  Review of Symptoms  General:  Negative for nexplained weight loss, fever Skin: Negative for new or changing mole, sore that won't heal HEENT: Negative for trouble hearing, trouble seeing, ringing in ears, mouth sores, hoarseness, change in voice, dysphagia. CV:  Negative for chest pain, dyspnea, edema, palpitations Resp: Negative for cough, dyspnea, hemoptysis GI: Negative for nausea, vomiting, diarrhea, constipation, abdominal pain, melena, hematochezia. GU: Negative for dysuria, incontinence, urinary hesitance, hematuria, vaginal or penile discharge, polyuria, sexual difficulty, lumps in testicle or breasts MSK: Negative for muscle cramps or aches, joint pain or swelling Neuro: Negative for  headaches, weakness, numbness, dizziness, passing out/fainting Psych: Negative for depression, +anxiety, memory problems      Objective:   Physical Exam BP 116/66  Pulse 60  Ht 5\' 11"  (1.803 m)  Wt 180 lb (81.647 kg)  BMI 25.12 kg/m2 Gen: alert, cooperative, NAD HEENT: AT/[place], sclera white, MMM, PERRL Neck: supple, no LAD, thyroid normal CV: RRR, no murmurs Pulm: CTAB, no wheezes or rales Abd: +BS, soft, NTND Ext: no edema, 2+ DP pulses bilaterally Neuro: no focal deficits     Assessment & Plan:

## 2013-07-27 ENCOUNTER — Ambulatory Visit: Payer: PRIVATE HEALTH INSURANCE | Admitting: Emergency Medicine

## 2013-12-20 ENCOUNTER — Emergency Department (INDEPENDENT_AMBULATORY_CARE_PROVIDER_SITE_OTHER)
Admission: EM | Admit: 2013-12-20 | Discharge: 2013-12-20 | Disposition: A | Discharge status: Home / Self Care | Payer: Self-pay | Source: Home / Self Care | Attending: Family Medicine | Admitting: Family Medicine

## 2013-12-20 ENCOUNTER — Encounter (HOSPITAL_COMMUNITY): Payer: Self-pay | Admitting: Emergency Medicine

## 2013-12-20 DIAGNOSIS — J020 Streptococcal pharyngitis: Secondary | ICD-10-CM

## 2013-12-20 LAB — POCT RAPID STREP A: STREPTOCOCCUS, GROUP A SCREEN (DIRECT): POSITIVE — AB

## 2013-12-20 MED ORDER — AMOXICILLIN 500 MG PO CAPS: 1000.0000 mg | ORAL_CAPSULE | Freq: Two times a day (BID) | ORAL | Status: DC

## 2013-12-20 NOTE — Discharge Instructions (Signed)
Thank you for coming in today. Call or go to the emergency room if you get worse, have trouble breathing, have chest pains, or palpitations.   Strep Throat Strep throat is an infection of the throat caused by a bacteria named Streptococcus pyogenes. Your caregiver may call the infection streptococcal "tonsillitis" or "pharyngitis" depending on whether there are signs of inflammation in the tonsils or back of the throat. Strep throat is most common in children aged 5-15 years during the cold months of the year, but it can occur in people of any age during any season. This infection is spread from person to person (contagious) through coughing, sneezing, or other close contact. SYMPTOMS   Fever or chills.  Painful, swollen, red tonsils or throat.  Pain or difficulty when swallowing.  White or yellow spots on the tonsils or throat.  Swollen, tender lymph nodes or "glands" of the neck or under the jaw.  Red rash all over the body (rare). DIAGNOSIS  Many different infections can cause the same symptoms. A test must be done to confirm the diagnosis so the right treatment can be given. A "rapid strep test" can help your caregiver make the diagnosis in a few minutes. If this test is not available, a light swab of the infected area can be used for a throat culture test. If a throat culture test is done, results are usually available in a day or two. TREATMENT  Strep throat is treated with antibiotic medicine. HOME CARE INSTRUCTIONS   Gargle with 1 tsp of salt in 1 cup of warm water, 3-4 times per day or as needed for comfort.  Family members who also have a sore throat or fever should be tested for strep throat and treated with antibiotics if they have the strep infection.  Make sure everyone in your household washes their hands well.  Do not share food, drinking cups, or personal items that could cause the infection to spread to others.  You may need to eat a soft food diet until your sore  throat gets better.  Drink enough water and fluids to keep your urine clear or pale yellow. This will help prevent dehydration.  Get plenty of rest.  Stay home from school, daycare, or work until you have been on antibiotics for 24 hours.  Only take over-the-counter or prescription medicines for pain, discomfort, or fever as directed by your caregiver.  If antibiotics are prescribed, take them as directed. Finish them even if you start to feel better. SEEK MEDICAL CARE IF:   The glands in your neck continue to enlarge.  You develop a rash, cough, or earache.  You cough up green, yellow-brown, or bloody sputum.  You have pain or discomfort not controlled by medicines.  Your problems seem to be getting worse rather than better. SEEK IMMEDIATE MEDICAL CARE IF:   You develop any new symptoms such as vomiting, severe headache, stiff or painful neck, chest pain, shortness of breath, or trouble swallowing.  You develop severe throat pain, drooling, or changes in your voice.  You develop swelling of the neck, or the skin on the neck becomes red and tender.  You have a fever.  You develop signs of dehydration, such as fatigue, dry mouth, and decreased urination.  You become increasingly sleepy, or you cannot wake up completely. Document Released: 06/06/2000 Document Revised: 05/26/2012 Document Reviewed: 08/08/2010 [facility]ExitCare Patient Information 2015 [place], [place]. This information is not intended to replace advice given to you by your health care provider.  Make sure you discuss any questions you have with your health care provider. ° °

## 2013-12-20 NOTE — ED Notes (Signed)
C/o  Sore throat x 3 days.  Fever on Friday only.  Denies n/v.  Mild relief with otc pain meds.

## 2013-12-20 NOTE — ED Provider Notes (Signed)
Ethan Peck is a 28 y.o. male who presents to Urgent Care today for sore throat. Symptoms present for 4 days. Patient denies any significant cough congestion runny nose. He has mild fever. He's tried over-the-counter medications which have helped only temporarily. No nausea vomiting or diarrhea.   Past Medical History  Diagnosis Date  . Kidney stone   . Anxiety   . Depression    History  Substance Use Topics  . Smoking status: Never Smoker   . Smokeless tobacco: Never Used  . Alcohol Use: Yes     Comment: on occasion (4 drinks)   ROS as above Medications: No current facility-administered medications for this encounter.   Current Outpatient Prescriptions  Medication Sig Dispense Refill  . amoxicillin (AMOXIL) 500 MG capsule Take 2 capsules (1,000 mg total) by mouth 2 (two) times daily.  40 capsule  0  . busPIRone (BUSPAR) 7.5 MG tablet Take 1 tablet (7.5 mg total) by mouth 2 (two) times daily.  60 tablet  1    Exam:  BP 131/78  Pulse 50  Temp(Src) 98.4 F (36.9 C) (Oral)  Resp 18  SpO2 99% Gen: Well NAD HEENT: EOMI,  MMM posterior pharynx is erythematous with exudate. Mild bilateral tender cervical lymphadenopathy present as well. Lungs: Normal work of breathing. CTABL Heart: Bradycardia but regular no MRG Abd: NABS, Soft. NT, ND Exts: Brisk capillary refill, warm and well perfused.   Results for orders placed during the hospital encounter of 12/20/13 (from the past 24 hour(s))  POCT RAPID STREP A ([facility]RG CARE ONLY)     Status: Abnormal   Collection Time    12/20/13  1:12 PM      Result Value Ref Range   Streptococcus, Group A Screen (Direct) POSITIVE (*) NEGATIVE   No results found.  Assessment and Plan: 28 y.o. male with strep throat. Plan to treat with amoxicillin. Followup as needed.  Discussed warning signs or symptoms. Please see discharge instructions. Patient expresses understanding.    Rodolph BongEvan S Corey, MD 12/20/13 [phone]

## 2018-02-10 ENCOUNTER — Emergency Department (HOSPITAL_BASED_OUTPATIENT_CLINIC_OR_DEPARTMENT_OTHER)
Admission: EM | Admit: 2018-02-10 | Discharge: 2018-02-11 | Disposition: A | Discharge status: Home / Self Care | Payer: Self-pay | Attending: Emergency Medicine | Admitting: Emergency Medicine

## 2018-02-10 ENCOUNTER — Encounter (HOSPITAL_BASED_OUTPATIENT_CLINIC_OR_DEPARTMENT_OTHER): Payer: Self-pay

## 2018-02-10 ENCOUNTER — Emergency Department (HOSPITAL_BASED_OUTPATIENT_CLINIC_OR_DEPARTMENT_OTHER): Payer: Self-pay

## 2018-02-10 ENCOUNTER — Other Ambulatory Visit: Payer: Self-pay

## 2018-02-10 DIAGNOSIS — R102 Pelvic and perineal pain: Secondary | ICD-10-CM | POA: Insufficient documentation

## 2018-02-10 DIAGNOSIS — Z79899 Other long term (current) drug therapy: Secondary | ICD-10-CM | POA: Insufficient documentation

## 2018-02-10 LAB — COMPREHENSIVE METABOLIC PANEL
ALT: 41 U/L (ref 0–44)
AST: 24 U/L (ref 15–41)
Albumin: 4.3 g/dL (ref 3.5–5.0)
Alkaline Phosphatase: 62 U/L (ref 38–126)
Anion gap: 9 (ref 5–15)
BILIRUBIN TOTAL: 0.5 mg/dL (ref 0.3–1.2)
BUN: 14 mg/dL (ref 6–20)
CO2: 28 mmol/L (ref 22–32)
Calcium: 8.9 mg/dL (ref 8.9–10.3)
Chloride: 102 mmol/L (ref 98–111)
Creatinine, Ser: 1.1 mg/dL (ref 0.61–1.24)
GFR calc Af Amer: >60 mL/min (ref >60)
Glucose, Bld: 103 mg/dL — ABNORMAL HIGH (ref 70–99)
Potassium: 4.1 mmol/L (ref 3.5–5.1)
Sodium: 139 mmol/L (ref 135–145)
TOTAL PROTEIN: 7.2 g/dL (ref 6.5–8.1)

## 2018-02-10 LAB — URINALYSIS, ROUTINE W REFLEX MICROSCOPIC
Bilirubin Urine: NEGATIVE
Glucose, UA: NEGATIVE mg/dL
Hgb urine dipstick: NEGATIVE
KETONES UR: NEGATIVE mg/dL
LEUKOCYTES UA: NEGATIVE
NITRITE: NEGATIVE
PH: 7 (ref 5.0–8.0)
PROTEIN: NEGATIVE mg/dL
Specific Gravity, Urine: 1.015 (ref 1.005–1.030)

## 2018-02-10 LAB — CBC
HEMATOCRIT: 41.6 % (ref 39.0–52.0)
Hemoglobin: 14.1 g/dL (ref 13.0–17.0)
MCH: 29.9 pg (ref 26.0–34.0)
MCHC: 33.9 g/dL (ref 30.0–36.0)
MCV: 88.1 fL (ref 78.0–100.0)
PLATELETS: 215 10*3/uL (ref 150–400)
RBC: 4.72 MIL/uL (ref 4.22–5.81)
RDW: 12.1 % (ref 11.5–15.5)
WBC: 5.8 10*3/uL (ref 4.0–10.5)

## 2018-02-10 LAB — LIPASE, BLOOD: Lipase: 47 U/L (ref 11–51)

## 2018-02-10 MED ORDER — HYDROMORPHONE HCL 1 MG/ML IJ SOLN
0.5000 mg | Freq: Once | INTRAMUSCULAR | Status: AC
  Administered 2018-02-10: 0.5 mg via INTRAVENOUS
  Filled 2018-02-10: qty 1

## 2018-02-10 MED ORDER — ONDANSETRON HCL 4 MG/2ML IJ SOLN
INTRAMUSCULAR | Status: AC
  Administered 2018-02-10: 4 mg via INTRAVENOUS
  Filled 2018-02-10: qty 2

## 2018-02-10 MED ORDER — ONDANSETRON HCL 4 MG/2ML IJ SOLN
4.0000 mg | Freq: Once | INTRAMUSCULAR | Status: AC | PRN
  Administered 2018-02-10: 4 mg via INTRAVENOUS

## 2018-02-10 MED ORDER — LACTATED RINGERS IV BOLUS
1000.0000 mL | Freq: Once | INTRAVENOUS | Status: AC
  Administered 2018-02-10: 1000 mL via INTRAVENOUS

## 2018-02-10 NOTE — ED Triage Notes (Signed)
C/o lower abd/lower back pain-states feels like kidney stone pain x today-NAD-steady gait

## 2018-02-10 NOTE — ED Provider Notes (Signed)
[facility] EMERGENCY DEPARTMENT Provider Note   CSN: [id number] Arrival date & time: 02/10/18  2113     History   Chief Complaint Chief Complaint  Patient presents with  . Abdominal Pain    HPI Ethan Peck is a 32 y.o. male.  The history is provided by the patient.  Abdominal Pain   This is a new problem. The current episode started yesterday. The problem occurs constantly. The problem has not changed since onset.The pain is located in the suprapubic region. The pain is moderate.    Past Medical History:  Diagnosis Date  . Anxiety   . Depression   . Kidney stone     Patient Active Problem List   Diagnosis Date Noted  . Anxiety state, unspecified 05/09/2013  . Depression 05/09/2013  . Annual physical exam 05/09/2013    Past Surgical History:  Procedure Laterality Date  . WISDOM TOOTH EXTRACTION  2007        Home Medications    Prior to Admission medications   Medication Sig Start Date End Date Taking? Authorizing Provider  amoxicillin (AMOXIL) 500 MG capsule Take 2 capsules (1,000 mg total) by mouth 2 (two) times daily. 12/20/13   Rodolph Bongorey, Evan S, MD  busPIRone (BUSPAR) 7.5 MG tablet Take 1 tablet (7.5 mg total) by mouth 2 (two) times daily. 05/09/13   Charm RingsHonig, Erin J, MD    Family History Family History  Problem Relation Age of Onset  . Depression Mother   . Hyperlipidemia Mother   . Hypertension Mother   . Drug abuse Mother   . Depression Brother   . Drug abuse Brother     Social History Social History   Tobacco Use  . Smoking status: Never Smoker  . Smokeless tobacco: Never Used  Substance Use Topics  . Alcohol use: Yes    Comment: occ  . Drug use: No     Allergies   Patient has no known allergies.   Review of Systems Review of Systems  Gastrointestinal: Positive for abdominal pain.  All other systems reviewed and are negative.    Physical Exam Updated Vital Signs BP 121/79 (BP Location: Right Arm)   Pulse (!) 50    Temp 98.3 F (36.8 C) (Oral)   Resp 16   Ht 5\' 11"  (1.803 m)   Wt 85.9 kg   SpO2 100%   BMI 26.41 kg/m   Physical Exam  Constitutional: He is oriented to person, place, and time. He appears well-developed and well-nourished.  HENT:  Head: Normocephalic and atraumatic.  Eyes: Pupils are equal, round, and reactive to light. EOM are normal.  Neck: Normal range of motion.  Cardiovascular: Normal rate.  Pulmonary/Chest: Effort normal. No respiratory distress.  Abdominal: Soft. Normal appearance. He exhibits no distension. There is tenderness (mild suprapubic). There is no guarding.  Musculoskeletal: Normal range of motion. He exhibits no edema or deformity.  Neurological: He is alert and oriented to person, place, and time.  Skin: Skin is warm and dry.  Nursing note and vitals reviewed.    ED Treatments / Results  Labs (all labs ordered are listed, but only abnormal results are displayed) Labs Reviewed  COMPREHENSIVE METABOLIC PANEL - Abnormal; Notable for the following components:      Result Value   Glucose, Bld 103 (*)    All other components within normal limits  LIPASE, BLOOD  CBC  URINALYSIS, ROUTINE W REFLEX MICROSCOPIC    EKG None  Radiology Ct Renal Stone Study  Result Date: 02/10/2018 CLINICAL DATA:  Low back pain, dysuria and bilateral flank pain. EXAM: CT ABDOMEN AND PELVIS WITHOUT CONTRAST TECHNIQUE: Multidetector CT imaging of the abdomen and pelvis was performed following the standard protocol without IV contrast. COMPARISON:  09/13/2011 FINDINGS: Lower chest: Normal heart size without pericardial effusion. Minimal subsegmental atelectasis at the lung bases. Hepatobiliary: No focal liver abnormality is seen. No gallstones, gallbladder wall thickening, or biliary dilatation. Pancreas: No pancreatic ductal dilatation. 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 Small mesenteric lymph nodes are redemonstrated  without pathologic enlargement. Spleen: No splenomegaly or mass. Adrenals/Urinary Tract: Normal bilateral adrenal glands. Punctate nonobstructing calculi of the right kidney without hydroureteronephrosis. Low-density focus in the interpolar left kidney may represent a subtle cyst but is too small to characterize. This measures approximately 7 mm. Urinary bladder is unremarkable for the degree of distention. Stomach/Bowel: Stomach is within normal limits. Appendix is nondistended and contains hyperdense material within either representing small appendicoliths or contrast. No evidence of bowel wall thickening, distention, or inflammatory changes. Vascular/Lymphatic: No significant vascular findings are present. No enlarged abdominal or pelvic lymph nodes. Numerous small subcentimeter mesenteric lymph nodes are seen near the root of the mesentery similar to prior. Reproductive: Prostate is unremarkable. Other: No free air nor free fluid. Musculoskeletal: No acute nor suspicious osseous abnormalities. Mild concentric disc bulges L4-5 and L5-S1. IMPRESSION: 1. Punctate right-sided nonobstructing renal calculi without evidence of obstructive uropathy. 2. 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 3. Mild concentric disc bulges L4-5 and L5-S1. 4. 7 mm hypodensity in the interpolar left kidney too small to characterize but statistically consistent with a cyst. Electronically Signed   By: Tollie Ethavid  Kwon M.D.   On: 02/10/2018 22:47    Procedures Procedures (including critical care time)  Medications Ordered in ED Medications  HYDROmorphone (DILAUDID) injection 0.5 mg (0.5 mg Intravenous Given 02/10/18 2235)  lactated ringers bolus 1,000 mL (0 mLs Intravenous Stopped 02/10/18 2335)  ondansetron (ZOFRAN) injection 4 mg (4 mg  Intravenous Given 02/10/18 2215)     Initial Impression / Assessment and Plan / ED Course  I have reviewed the triage vital signs and the nursing notes.  Pertinent labs & imaging results that were available during my care of the patient were reviewed by me and considered in my medical decision making (see chart for details).     Unclear etiology for patient's pain. Improved here. Possibly related to dehydration? Plan for discharge. Stable for same.   Final Clinical Impressions(s) / ED Diagnoses   Final diagnoses:  Suprapubic pain    ED Discharge Orders    None       Jaselynn Tamas, Barbara CowerJason, MD 02/10/18 2354

## 2018-03-13 ENCOUNTER — Emergency Department (HOSPITAL_BASED_OUTPATIENT_CLINIC_OR_DEPARTMENT_OTHER)
Admission: EM | Admit: 2018-03-13 | Discharge: 2018-03-13 | Disposition: A | Discharge status: Home / Self Care | Payer: Self-pay | Attending: Emergency Medicine | Admitting: Emergency Medicine

## 2018-03-13 ENCOUNTER — Encounter (HOSPITAL_BASED_OUTPATIENT_CLINIC_OR_DEPARTMENT_OTHER): Payer: Self-pay | Admitting: Emergency Medicine

## 2018-03-13 ENCOUNTER — Other Ambulatory Visit: Payer: Self-pay

## 2018-03-13 ENCOUNTER — Emergency Department (HOSPITAL_BASED_OUTPATIENT_CLINIC_OR_DEPARTMENT_OTHER): Payer: Self-pay

## 2018-03-13 DIAGNOSIS — Y929 Unspecified place or not applicable: Secondary | ICD-10-CM | POA: Insufficient documentation

## 2018-03-13 DIAGNOSIS — Y998 Other external cause status: Secondary | ICD-10-CM | POA: Insufficient documentation

## 2018-03-13 DIAGNOSIS — Y9389 Activity, other specified: Secondary | ICD-10-CM | POA: Insufficient documentation

## 2018-03-13 DIAGNOSIS — S46912A Strain of unspecified muscle, fascia and tendon at shoulder and upper arm level, left arm, initial encounter: Secondary | ICD-10-CM | POA: Insufficient documentation

## 2018-03-13 DIAGNOSIS — X509XXA Other and unspecified overexertion or strenuous movements or postures, initial encounter: Secondary | ICD-10-CM | POA: Insufficient documentation

## 2018-03-13 MED ORDER — IBUPROFEN 400 MG PO TABS
600.0000 mg | ORAL_TABLET | Freq: Once | ORAL | Status: AC
  Administered 2018-03-13: 600 mg via ORAL
  Filled 2018-03-13: qty 1
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
## 2019-04-13 ENCOUNTER — Emergency Department (HOSPITAL_BASED_OUTPATIENT_CLINIC_OR_DEPARTMENT_OTHER)
Admission: EM | Admit: 2019-04-13 | Discharge: 2019-04-14 | Disposition: A | Discharge status: Home / Self Care | Payer: Self-pay | Attending: Emergency Medicine | Admitting: Emergency Medicine

## 2019-04-13 ENCOUNTER — Other Ambulatory Visit: Payer: Self-pay

## 2019-04-13 ENCOUNTER — Emergency Department (HOSPITAL_BASED_OUTPATIENT_CLINIC_OR_DEPARTMENT_OTHER): Payer: Self-pay

## 2019-04-13 ENCOUNTER — Encounter (HOSPITAL_BASED_OUTPATIENT_CLINIC_OR_DEPARTMENT_OTHER): Payer: Self-pay

## 2019-04-13 DIAGNOSIS — Z79899 Other long term (current) drug therapy: Secondary | ICD-10-CM | POA: Insufficient documentation

## 2019-04-13 DIAGNOSIS — Y9389 Activity, other specified: Secondary | ICD-10-CM | POA: Insufficient documentation

## 2019-04-13 DIAGNOSIS — S99911A Unspecified injury of right ankle, initial encounter: Secondary | ICD-10-CM

## 2019-04-13 DIAGNOSIS — Y999 Unspecified external cause status: Secondary | ICD-10-CM | POA: Insufficient documentation

## 2019-04-13 DIAGNOSIS — Y9289 Other specified places as the place of occurrence of the external cause: Secondary | ICD-10-CM | POA: Insufficient documentation

## 2019-04-13 DIAGNOSIS — X500XXA Overexertion from strenuous movement or load, initial encounter: Secondary | ICD-10-CM | POA: Insufficient documentation

## 2019-04-13 MED ORDER — NAPROXEN 500 MG PO TABS: ORAL_TABLET | ORAL | 0 refills | Status: DC

## 2019-04-13 MED ORDER — NAPROXEN 250 MG PO TABS
500.0000 mg | ORAL_TABLET | Freq: Once | ORAL | Status: AC
  Administered 2019-04-13: 500 mg via ORAL
  Filled 2019-04-13: qty 2

## 2019-04-13 NOTE — ED Provider Notes (Signed)
[facility] Provider Note: Ethan Dell, MD, FACEP  CSN: [id number] MRN: [id number] ARRIVAL: 04/13/19 at 2234 ROOM: [facility]12/[facility]12   CHIEF COMPLAINT  Foot Injury   HISTORY OF PRESENT ILLNESS  04/13/19 11:19 PM Ethan Peck is a 33 y.o. male with remote right ankle fracture.  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  He twisted his right ankle 2 days ago when standing up too fast.  He worked on it all day yesterday.  As a result his right ankle is is painful.  He describes the pain as throbbing and rates it as an 8 out of 10, worse with weightbearing.  He also has some pain when dorsiflexing his right foot.  There is some subjective swelling but no deformity.  He denies other injury.  He has been taking ibuprofen 800 mg without adequate relief.   Past Medical History:  Diagnosis Date  . Anxiety   . Depression   . Kidney stone     Past Surgical History:  Procedure Laterality Date  . WISDOM TOOTH EXTRACTION  2007    Family History  Problem Relation Age of Onset  . Depression Mother   . Hyperlipidemia Mother   . Hypertension Mother   . Drug abuse Mother   . Depression Brother   . Drug abuse Brother     Social History   Tobacco Use  . Smoking status: Never Smoker  . Smokeless tobacco: Never Used  Substance Use Topics  . Alcohol use: Not Currently    Comment: occ  . Drug use: No    Prior to Admission medications   Medication Sig Start Date End Date Taking? Authorizing Provider  citalopram (CELEXA) 40 MG tablet Take 40 mg by mouth daily.   Yes [provider]  traZODone (DESYREL) 50 MG tablet Take 50 mg by mouth at bedtime.   Yes [provider]  amoxicillin (AMOXIL) 500 MG capsule Take 2 capsules (1,000 mg total) by mouth 2 (two) times daily. 12/20/13   Rodolph Bong, MD  busPIRone (BUSPAR) 7.5 MG tablet Take 1 tablet (7.5 mg total) by mouth 2 (two) times daily. 05/09/13   Charm Rings, MD    Allergies Patient has no known  allergies.   REVIEW OF SYSTEMS  Negative except as noted here or in the History of Present Illness.   PHYSICAL EXAMINATION  Initial Vital Signs Blood pressure 137/85, pulse 82, temperature 98.2 F (36.8 C), temperature source Oral, resp. rate 16, height 5\' 10"  (1.778 m), weight 79.4 kg, SpO2 97 %.  Examination General: Well-developed, well-nourished male in no acute distress; appearance consistent with age of record HENT: normocephalic; atraumatic Eyes: pupils equal, round and reactive to light; extraocular muscles intact Neck: supple Heart: regular rate and rhythm Lungs: clear to auscultation bilaterally Abdomen: soft; nondistended; nontender; bowel sounds present Extremities: No deformity; mild tenderness of right ankle without ecchymosis, swelling or erythema; pulses normal Neurologic: Awake, alert and oriented; motor function intact in all extremities and symmetric; no facial droop Skin: Warm and dry Psychiatric: Normal mood and affect   RESULTS  Summary of this visit's results, reviewed by myself:   EKG Interpretation  Date/Time:    Ventricular Rate:    PR Interval:    QRS Duration:   QT Interval:    QTC Calculation:   R Axis:     Text Interpretation:        Laboratory Studies: No results found for this or any previous visit (  from the past 24 hour(s)). Imaging Studies: Dg Ankle Complete Right  Result Date: 04/13/2019 CLINICAL DATA:  Fall EXAM: RIGHT ANKLE - COMPLETE 3+ VIEW COMPARISON:  None. FINDINGS: There is no evidence of fracture, dislocation, or joint effusion. There is no evidence of arthropathy or other focal bone abnormality. Soft tissues are unremarkable. IMPRESSION: Negative. Electronically Signed   By: Prudencio Pair M.D.   On: 04/13/2019 23:14    ED COURSE and MDM  Nursing notes and initial vitals signs, including pulse oximetry, reviewed.  Vitals:   04/13/19 2240 04/13/19 2248  BP: 137/85   Pulse: 82   Resp: 16   Temp: 98.2 F (36.8 C)    TempSrc: Oral   SpO2: 97%   Weight: 79.4 kg 79.4 kg  Height: 5\' 10"  (1.778 m) 5\' 10"  (1.778 m)   There is no evidence of significant injury on exam or radiographs but we will place his ankle in an ASO and provide a postop shoe to brace his ankle while it heals.  We will refer to sports medicine if symptoms persist.  PROCEDURES    ED DIAGNOSES     ICD-10-CM   1. Right ankle injury, initial encounter  I71.245Y        Shanon Rosser, MD 04/13/19 2330
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
## 2019-05-27 ENCOUNTER — Other Ambulatory Visit: Payer: Self-pay

## 2019-05-27 ENCOUNTER — Emergency Department (HOSPITAL_BASED_OUTPATIENT_CLINIC_OR_DEPARTMENT_OTHER)
Admission: EM | Admit: 2019-05-27 | Discharge: 2019-05-27 | Disposition: A | Discharge status: Home / Self Care | Payer: Self-pay | Attending: Emergency Medicine | Admitting: Emergency Medicine

## 2019-05-27 ENCOUNTER — Encounter (HOSPITAL_BASED_OUTPATIENT_CLINIC_OR_DEPARTMENT_OTHER): Payer: Self-pay

## 2019-05-27 DIAGNOSIS — M542 Cervicalgia: Secondary | ICD-10-CM | POA: Insufficient documentation

## 2019-05-27 DIAGNOSIS — M25511 Pain in right shoulder: Secondary | ICD-10-CM | POA: Insufficient documentation

## 2019-05-27 DIAGNOSIS — Z79899 Other long term (current) drug therapy: Secondary | ICD-10-CM | POA: Insufficient documentation

## 2019-05-27 MED ORDER — LIDOCAINE 5 % EX PTCH: 1.0000 | MEDICATED_PATCH | CUTANEOUS | 0 refills | Status: DC

## 2019-05-27 MED ORDER — METHOCARBAMOL 500 MG PO TABS: 500.0000 mg | ORAL_TABLET | Freq: Two times a day (BID) | ORAL | 0 refills | Status: DC

## 2019-05-27 MED ORDER — DICLOFENAC SODIUM 1 % EX GEL: 4.0000 g | Freq: Four times a day (QID) | CUTANEOUS | 0 refills | Status: DC

## 2019-05-27 NOTE — ED Triage Notes (Signed)
Pt c/o pain to right side of neck and right UE x 2 days-no known injury-does lift boxes at work-pain worse with movement-NAD-steady gait

## 2019-05-27 NOTE — Discharge Instructions (Addendum)
Take it easy, but do not lay around too much as this may make any stiffness worse.  Antiinflammatory medications: Take 600 mg of ibuprofen every 6 hours or 440 mg (over the counter dose) to 500 mg (prescription dose) of naproxen every 12 hours for the next 3 days. After this time, these medications may be used as needed for pain. Take these medications with food to avoid upset stomach. Choose only one of these medications, do not take them together. Acetaminophen (generic for Tylenol): Should you continue to have additional pain while taking the ibuprofen or naproxen, you may add in acetaminophen as needed. Your daily total maximum amount of acetaminophen from all sources should be limited to 4000mg /day for persons without liver problems, or 2000mg /day for those with liver problems. Methocarbamol: Methocarbamol (generic for Robaxin) is a muscle relaxer and can help relieve stiff muscles or muscle spasms.  Do not drive or perform other dangerous activities while taking this medication as it can cause drowsiness as well as changes in reaction time and judgement. Diclofenac gel: This is a topical anti-inflammatory medication and can be applied directly to the painful region.  Do not use on the face or genitals.  This medication may be used as an alternative to oral anti-inflammatory medications, such as ibuprofen or naproxen. Lidocaine patches: These are available via either prescription or over-the-counter. The over-the-counter option may be more economical one and are likely just as effective. There are multiple over-the-counter brands, such as Salonpas. Exercises: Be sure to perform the attached exercises starting with three times a week and working up to performing them daily. This is an essential part of preventing long term problems.  Follow up: Follow up with a primary care provider or orthopedic specialist for any future management of these complaints. Be sure to follow up within 7-10 days. Return:  Return to the ED should symptoms worsen.  For prescription assistance, may try using prescription discount sites or apps, such as goodrx.com

## 2019-05-27 NOTE — ED Provider Notes (Signed)
[facility] EMERGENCY DEPARTMENT Provider Note   CSN: [id number] Arrival date & time: 05/27/19  2017     History   Chief Complaint Chief Complaint  Patient presents with  . Neck Pain    HPI Ethan Peck is a 33 y.o. male.     HPI   Ethan Peck is a 33 y.o. male, with a history of anxiety and depression, presenting to the ED with right-sided neck and shoulder pain for the last several days.  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  Pain is described as a tightness and soreness, moderate to severe, radiating into the right arm.  He has been taking naproxen without complete relief. Denies fever, numbness, weakness, falls/trauma, swelling, or any other complaints.    Past Medical History:  Diagnosis Date  . Anxiety   . Depression   . Kidney stone     Patient Active Problem List   Diagnosis Date Noted  . Anxiety state, unspecified 05/09/2013  . Depression 05/09/2013  . Annual physical exam 05/09/2013    Past Surgical History:  Procedure Laterality Date  . WISDOM TOOTH EXTRACTION  2007        Home Medications    Prior to Admission medications   Medication Sig Start Date End Date Taking? Authorizing Provider  citalopram (CELEXA) 40 MG tablet Take 40 mg by mouth daily.    [provider]  diclofenac Sodium (VOLTAREN) 1 % GEL Apply 4 g topically 4 (four) times daily. 05/27/19   Aymara Sassi C, PA-C  lidocaine (LIDODERM) 5 % Place 1 patch onto the skin daily. Remove & Discard patch within 12 hours or as directed by MD 05/27/19   Ahmere Hemenway C, PA-C  methocarbamol (ROBAXIN) 500 MG tablet Take 1 tablet (500 mg total) by mouth 2 (two) times daily. 05/27/19   Emmakate Hypes C, PA-C  naproxen (NAPROSYN) 500 MG tablet Take 1 tablet twice daily as needed for ankle pain. 04/13/19   Molpus, John, MD  traZODone (DESYREL) 50 MG tablet Take 50 mg by mouth at bedtime.    [provider]    Family History Family History  Problem  Relation Age of Onset  . Depression Mother   . Hyperlipidemia Mother   . Hypertension Mother   . Drug abuse Mother   . Depression Brother   . Drug abuse Brother     Social History Social History   Tobacco Use  . Smoking status: Never Smoker  . Smokeless tobacco: Never Used  Substance Use Topics  . Alcohol use: Yes    Comment: occ  . Drug use: No     Allergies   Patient has no known allergies.   Review of Systems Review of Systems  Constitutional: Negative for fever.  Respiratory: Negative for shortness of breath.   Cardiovascular: Negative for chest pain.  Gastrointestinal: Negative for nausea and vomiting.  Musculoskeletal: Positive for arthralgias and neck pain.  Neurological: Negative for weakness, numbness and headaches.     Physical Exam Updated Vital Signs BP 120/83 (BP Location: Left Arm)   Pulse 72   Temp 98.7 F (37.1 C) (Oral)   Resp 18   Ht 5\' 11"  (1.803 m)   Wt 83.9 kg   SpO2 99%   BMI 25.80 kg/m   Physical Exam Vitals signs and nursing note reviewed.  Constitutional:      General: He is not in acute distress.    Appearance: He is well-developed. He is not diaphoretic.  HENT:     Head: Normocephalic and atraumatic.  Eyes:     Conjunctiva/sclera: Conjunctivae normal.  Neck:     Musculoskeletal: Normal range of motion and neck supple. Muscular tenderness present.   Cardiovascular:     Rate and Rhythm: Normal rate and regular rhythm.     Pulses:          Radial pulses are 2+ on the right side and 2+ on the left side.  Pulmonary:     Effort: Pulmonary effort is normal.  Musculoskeletal:       Back:     Comments: Tenderness to the right cervical musculature into the right trapezius. Full range of motion in the right shoulder, though painful. No noted deformity, swelling, color change, or instability. No midline spinal tenderness.   Skin:    General: Skin is warm and dry.     Coloration: Skin is not pale.  Neurological:     Mental  Status: He is alert.     Comments: Sensation grossly intact to light touch in the extremities.  Grip strengths equal bilaterally.  Strength 5/5 in all extremities. No gait disturbance. Coordination intact. Cranial nerves III-XII grossly intact. No facial droop.   Psychiatric:        Behavior: Behavior normal.      ED Treatments / Results  Labs (all labs ordered are listed, but only abnormal results are displayed) Labs Reviewed - No data to display  EKG None  Radiology No results found.  Procedures Procedures (including critical care time)  Medications Ordered in ED Medications - No data to display   Initial Impression / Assessment and Plan / ED Course  I have reviewed the triage vital signs and the nursing notes.  Pertinent labs & imaging results that were available during my care of the patient were reviewed by me and considered in my medical decision making (see chart for details).        Patient presents with pain in the right neck and trapezius.  Muscular origin of patient's symptoms is high on the differential.  No evidence of neurovascular compromise.  PCP versus orthopedic follow-up, as needed. The patient was given instructions for home care as well as return precautions. Patient voices understanding of these instructions, accepts the plan, and is comfortable with discharge.  Final Clinical Impressions(s) / ED Diagnoses   Final diagnoses:  Neck pain    ED Discharge Orders         Ordered    methocarbamol (ROBAXIN) 500 MG tablet  2 times daily     05/27/19 2216    lidocaine (LIDODERM) 5 %  Every 24 hours     05/27/19 2216    diclofenac Sodium (VOLTAREN) 1 % GEL  4 times daily     05/27/19 2216           Layla Maw 05/27/19 2340    Tegeler, Gwenyth Allegra, MD 05/28/19 [phone]

## 2020-03-05 ENCOUNTER — Emergency Department (HOSPITAL_BASED_OUTPATIENT_CLINIC_OR_DEPARTMENT_OTHER)
Admission: EM | Admit: 2020-03-05 | Discharge: 2020-03-05 | Disposition: A | Discharge status: Home / Self Care | Payer: Self-pay | Attending: Emergency Medicine | Admitting: Emergency Medicine

## 2020-03-05 ENCOUNTER — Other Ambulatory Visit: Payer: Self-pay

## 2020-03-05 ENCOUNTER — Encounter (HOSPITAL_BASED_OUTPATIENT_CLINIC_OR_DEPARTMENT_OTHER): Payer: Self-pay

## 2020-03-05 DIAGNOSIS — Y999 Unspecified external cause status: Secondary | ICD-10-CM | POA: Insufficient documentation

## 2020-03-05 DIAGNOSIS — Z79899 Other long term (current) drug therapy: Secondary | ICD-10-CM | POA: Insufficient documentation

## 2020-03-05 DIAGNOSIS — T161XXA Foreign body in right ear, initial encounter: Secondary | ICD-10-CM | POA: Insufficient documentation

## 2020-03-05 DIAGNOSIS — Y929 Unspecified place or not applicable: Secondary | ICD-10-CM | POA: Insufficient documentation

## 2020-03-05 DIAGNOSIS — Y939 Activity, unspecified: Secondary | ICD-10-CM | POA: Insufficient documentation

## 2020-03-05 DIAGNOSIS — W458XXA Other foreign body or object entering through skin, initial encounter: Secondary | ICD-10-CM | POA: Insufficient documentation

## 2020-03-05 NOTE — ED Provider Notes (Signed)
[facility] EMERGENCY DEPARTMENT Provider Note   CSN: [id number] Arrival date & time: 03/05/20  1600     History Chief Complaint  Patient presents with  . Foreign Body in Ear    Ethan Peck is a 34 y.o. male.  Patient presents with right ear foreign body sustained prior to arrival when he was using a Q-tip.  He states that the end broke off.  He was cleaning his ears.  No pain.  He was afraid he would push it further into the ear canal.        Past Medical History:  Diagnosis Date  . Anxiety   . Depression   . Kidney stone     Patient Active Problem List   Diagnosis Date Noted  . Anxiety state, unspecified 05/09/2013  . Depression 05/09/2013  . Annual physical exam 05/09/2013    Past Surgical History:  Procedure Laterality Date  . WISDOM TOOTH EXTRACTION  2007       Family History  Problem Relation Age of Onset  . Depression Mother   . Hyperlipidemia Mother   . Hypertension Mother   . Drug abuse Mother   . Depression Brother   . Drug abuse Brother     Social History   Tobacco Use  . Smoking status: Never Smoker  . Smokeless tobacco: Never Used  Vaping Use  . Vaping Use: Never used  Substance Use Topics  . Alcohol use: Not Currently  . Drug use: No    Home Medications Prior to Admission medications   Medication Sig Start Date End Date Taking? Authorizing Provider  citalopram (CELEXA) 40 MG tablet Take 40 mg by mouth daily.    [provider]  diclofenac Sodium (VOLTAREN) 1 % GEL Apply 4 g topically 4 (four) times daily. 05/27/19   Joy, Shawn C, PA-C  lidocaine (LIDODERM) 5 % Place 1 patch onto the skin daily. Remove & Discard patch within 12 hours or as directed by MD 05/27/19   Joy, Shawn C, PA-C  methocarbamol (ROBAXIN) 500 MG tablet Take 1 tablet (500 mg total) by mouth 2 (two) times daily. 05/27/19   Joy, Shawn C, PA-C  naproxen (NAPROSYN) 500 MG tablet Take 1 tablet twice daily as needed for ankle pain. 04/13/19    Molpus, John, MD  traZODone (DESYREL) 50 MG tablet Take 50 mg by mouth at bedtime.    [provider]    Allergies    Patient has no known allergies.  Review of Systems   Review of Systems  HENT: Negative for ear discharge and ear pain.     Physical Exam Updated Vital Signs BP 130/81 (BP Location: Left Arm)   Pulse 82   Temp 98.7 F (37.1 C) (Oral)   Resp 16   Ht 5\' 11"  (1.803 m)   Wt 85.3 kg   SpO2 99%   BMI 26.22 kg/m   Physical Exam Vitals and nursing note reviewed.  Constitutional:      Appearance: He is well-developed.  HENT:     Head: Normocephalic and atraumatic.     Right Ear: Tympanic membrane, ear canal and external ear normal.     Ears:     Comments: Foreign body noted in right ear canal consistent with Q-tip. Eyes:     Conjunctiva/sclera: Conjunctivae normal.  Pulmonary:     Effort: No respiratory distress.  Musculoskeletal:     Cervical back: Normal range of motion and neck supple.  Skin:    General: Skin  is warm and dry.  Neurological:     Mental Status: He is alert.     ED Results / Procedures / Treatments   Labs (all labs ordered are listed, but only abnormal results are displayed) Labs Reviewed - No data to display  EKG None  Radiology No results found.  Procedures .Foreign Body Removal  Date/Time: 03/05/2020 7:06 PM Performed by: Renne Crigler, PA-C Authorized by: Renne Crigler, PA-C  Body area: ear Location details: right ear Removal mechanism: alligator forceps Complexity: simple 1 objects recovered. Objects recovered: cotton swab Post-procedure assessment: foreign body removed Patient tolerance: patient tolerated the procedure well with no immediate complications   (including critical care time)  Medications Ordered in ED Medications - No data to display  ED Course  I have reviewed the triage vital signs and the nursing notes.  Pertinent labs & imaging results that were available during my care of the  patient were reviewed by me and considered in my medical decision making (see chart for details).  Patient seen and examined.  Q-tip removed with alligator forceps without complication.  Exam otherwise normal.  Vital signs reviewed and are as follows: BP 130/81 (BP Location: Left Arm)   Pulse 82   Temp 98.7 F (37.1 C) (Oral)   Resp 16   Ht 5\' 11"  (1.803 m)   Wt 85.3 kg   SpO2 99%   BMI 26.22 kg/m      MDM Rules/Calculators/A&P                          Ear foreign body removed without complication. Final Clinical Impression(s) / ED Diagnoses Final diagnoses:  Foreign body of right ear, initial encounter    Rx / DC Orders ED Discharge Orders    None       , Renne Crigler 03/05/20 1906    03/07/20, MD 03/05/20 2315

## 2020-03-05 NOTE — ED Triage Notes (Signed)
Pt states he has cotton from qtip in right ear x today-NAD-steady gait

## 2020-06-30 ENCOUNTER — Emergency Department (HOSPITAL_BASED_OUTPATIENT_CLINIC_OR_DEPARTMENT_OTHER)
Admission: EM | Admit: 2020-06-30 | Discharge: 2020-06-30 | Disposition: A | Discharge status: Home / Self Care | Payer: HRSA Program | Attending: Emergency Medicine | Admitting: Emergency Medicine

## 2020-06-30 ENCOUNTER — Encounter (HOSPITAL_BASED_OUTPATIENT_CLINIC_OR_DEPARTMENT_OTHER): Payer: Self-pay

## 2020-06-30 ENCOUNTER — Other Ambulatory Visit: Payer: Self-pay

## 2020-06-30 DIAGNOSIS — U071 COVID-19: Secondary | ICD-10-CM | POA: Diagnosis not present

## 2020-06-30 DIAGNOSIS — J069 Acute upper respiratory infection, unspecified: Secondary | ICD-10-CM | POA: Diagnosis not present

## 2020-06-30 DIAGNOSIS — R509 Fever, unspecified: Secondary | ICD-10-CM | POA: Diagnosis present

## 2020-06-30 NOTE — ED Notes (Signed)
Sunday am began having fevers, highest was 100.85F, has been taking Alcohol Cold and Flu, took Covid test at home this am and was negative. Denies any other signs. Has had both Covid Vaccines, 2nd was in March 2021. Here to be evaluated due to work requirements.

## 2020-06-30 NOTE — Discharge Instructions (Addendum)
If Covid test is negative you are cleared to go back to work

## 2020-06-30 NOTE — ED Triage Notes (Signed)
Pt reports fever, diaphoresis. Pt took a rapid test at home that was negative, but employer requested the Pt to have a ordered test done.

## 2020-06-30 NOTE — ED Provider Notes (Signed)
[facility] EMERGENCY DEPARTMENT Provider Note   CSN: [id number] Arrival date & time: 06/30/20  1409     History Chief Complaint  Patient presents with  . Fever    Ethan Peck is a 35 y.o. male.  The history is provided by the patient.  Fever Temp source:  Subjective Severity:  Mild Progression:  Resolved Chronicity:  New Relieved by:  Nothing Worsened by:  Nothing Ineffective treatments:  Acetaminophen Associated symptoms: cough   Associated symptoms: no chest pain, no chills, no dysuria, no ear pain, no rash, no sore throat and no vomiting   Risk factors: no sick contacts        Past Medical History:  Diagnosis Date  . Anxiety   . Depression   . Kidney stone     Patient Active Problem List   Diagnosis Date Noted  . Anxiety state, unspecified 05/09/2013  . Depression 05/09/2013  . Annual physical exam 05/09/2013    Past Surgical History:  Procedure Laterality Date  . WISDOM TOOTH EXTRACTION  2007       Family History  Problem Relation Age of Onset  . Depression Mother   . Hyperlipidemia Mother   . Hypertension Mother   . Drug abuse Mother   . Depression Brother   . Drug abuse Brother     Social History   Tobacco Use  . Smoking status: Never Smoker  . Smokeless tobacco: Never Used  Vaping Use  . Vaping Use: Some days  Substance Use Topics  . Alcohol use: Not Currently  . Drug use: No    Home Medications Prior to Admission medications   Medication Sig Start Date End Date Taking? Authorizing Provider  gabapentin (NEURONTIN) 600 MG tablet Take 600 mg by mouth 3 (three) times daily.   Yes [provider]  hydrOXYzine (ATARAX/VISTARIL) 10 MG tablet Take 10 mg by mouth 3 (three) times daily.   Yes [provider]  citalopram (CELEXA) 40 MG tablet Take 40 mg by mouth daily.    [provider]  diclofenac Sodium (VOLTAREN) 1 % GEL Apply 4 g topically 4 (four) times daily. 05/27/19   Joy, Shawn C, PA-C   lidocaine (LIDODERM) 5 % Place 1 patch onto the skin daily. Remove & Discard patch within 12 hours or as directed by MD 05/27/19   Joy, Shawn C, PA-C  methocarbamol (ROBAXIN) 500 MG tablet Take 1 tablet (500 mg total) by mouth 2 (two) times daily. 05/27/19   Joy, Shawn C, PA-C  naproxen (NAPROSYN) 500 MG tablet Take 1 tablet twice daily as needed for ankle pain. 04/13/19   Molpus, John, MD  traZODone (DESYREL) 50 MG tablet Take 50 mg by mouth at bedtime.    [provider]    Allergies    Patient has no known allergies.  Review of Systems   Review of Systems  Constitutional: Positive for fever. Negative for chills.  HENT: Negative for ear pain and sore throat.   Eyes: Negative for pain and visual disturbance.  Respiratory: Positive for cough. Negative for shortness of breath.   Cardiovascular: Negative for chest pain and palpitations.  Gastrointestinal: Negative for abdominal pain and vomiting.  Genitourinary: Negative for dysuria and hematuria.  Musculoskeletal: Negative for arthralgias and back pain.  Skin: Negative for color change and rash.  Neurological: Negative for seizures and syncope.  All other systems reviewed and are negative.   Physical Exam Updated Vital Signs  Ethan Triage Vitals  Enc Vitals Group  BP 06/30/20 1432 (!) 134/102     Pulse Rate 06/30/20 1432 64     Resp 06/30/20 1432 16     Temp 06/30/20 1432 98.2 F (36.8 C)     Temp Source 06/30/20 1432 Oral     SpO2 06/30/20 1432 97 %     Weight 06/30/20 1427 184 lb (83.5 kg)     Height 06/30/20 1427 5\' 11"  (1.803 m)     Head Circumference --      Peak Flow --      Pain Score 06/30/20 1427 0     Pain Loc --      Pain Edu? --      Excl. in GC? --     Physical Exam Vitals and nursing note reviewed.  Constitutional:      General: He is not in acute distress.    Appearance: He is well-developed and well-nourished. He is not ill-appearing.  HENT:     Head: Normocephalic and atraumatic.  Eyes:      Conjunctiva/sclera: Conjunctivae normal.  Cardiovascular:     Rate and Rhythm: Normal rate and regular rhythm.     Heart sounds: No murmur heard.   Pulmonary:     Effort: Pulmonary effort is normal. No respiratory distress.     Breath sounds: Normal breath sounds.  Abdominal:     Palpations: Abdomen is soft.     Tenderness: There is no abdominal tenderness.  Musculoskeletal:        General: No edema.     Cervical back: Neck supple.  Skin:    General: Skin is warm and dry.  Neurological:     Mental Status: He is alert.  Psychiatric:        Mood and Affect: Mood and affect normal.     Ethan Results / Procedures / Treatments   Labs (all labs ordered are listed, but only abnormal results are displayed) Labs Reviewed  SARS CORONAVIRUS 2 (TAT 6-24 HRS)    EKG None  Radiology No results found.  Procedures Procedures (including critical care time)  Medications Ordered in Ethan Medications - No data to display  Ethan Course  I have reviewed the triage vital signs and the nursing notes.  Pertinent labs & imaging results that were available during my care of the patient were reviewed by me and considered in my medical decision making (see chart for details).    MDM Rules/Calculators/A&P                          08/28/20 is here for Covid test for clearance for work.  Had a fever about 5 days ago but has been symptom-free for the last several days.  Had negative home test.  Patient overall appears well.  Normal vitals.  No signs of respiratory distress.  PCR test for Covid has been ordered.  Discharged in good condition.  Understands return precautions.  This chart was dictated using voice recognition software.  Despite best efforts to proofread,  errors can occur which can change the documentation meaning.   [name] was evaluated in Emergency Department on 06/30/2020 for the symptoms described in the history of present illness. He was evaluated in the context of  the global COVID-19 pandemic, which necessitated consideration that the patient might be at risk for infection with the SARS-CoV-2 virus that causes COVID-19. Institutional protocols and algorithms that pertain to the evaluation of patients at risk for COVID-19 are  in a state of rapid change based on information released by regulatory bodies including the CDC and federal and state organizations. These policies and algorithms were followed during the patient's care in the [facility].   Final Clinical Impression(s) / Ethan Diagnoses Final diagnoses:  Viral upper respiratory tract infection    Rx / DC Orders Ethan Discharge Orders    None       Virgina Norfolk, DO 06/30/20 1659

## 2020-07-01 LAB — SARS CORONAVIRUS 2 (TAT 6-24 HRS): SARS Coronavirus 2: POSITIVE — AB

## 2020-07-03 ENCOUNTER — Telehealth (HOSPITAL_COMMUNITY): Payer: Self-pay

## 2021-01-08 ENCOUNTER — Other Ambulatory Visit: Payer: Self-pay

## 2021-01-08 ENCOUNTER — Ambulatory Visit (HOSPITAL_COMMUNITY)
Admission: EM | Admit: 2021-01-08 | Discharge: 2021-01-08 | Disposition: A | Discharge status: Home / Self Care | Payer: 59 | Attending: Psychiatry | Admitting: Psychiatry

## 2021-01-08 DIAGNOSIS — F331 Major depressive disorder, recurrent, moderate: Secondary | ICD-10-CM | POA: Diagnosis not present

## 2021-01-08 DIAGNOSIS — F411 Generalized anxiety disorder: Secondary | ICD-10-CM | POA: Diagnosis not present

## 2021-01-08 MED ORDER — TRAZODONE HCL 50 MG PO TABS: 50.0000 mg | ORAL_TABLET | Freq: Every day | ORAL | 0 refills | Status: DC

## 2021-01-08 MED ORDER — ARIPIPRAZOLE 5 MG PO TABS: 5.0000 mg | ORAL_TABLET | Freq: Every day | ORAL | 0 refills | Status: DC

## 2021-01-08 MED ORDER — HYDROXYZINE PAMOATE 25 MG PO CAPS: 25.0000 mg | ORAL_CAPSULE | Freq: Three times a day (TID) | ORAL | 0 refills | Status: DC | PRN

## 2021-01-08 NOTE — Discharge Instructions (Addendum)

## 2021-01-08 NOTE — ED Notes (Signed)
Pt discharged in no acute distress. Verbalized understanding of discharge instructions reviewed on AVS. Resources given. Safety maintained. 

## 2021-01-08 NOTE — BH Assessment (Signed)
TTS triage: Patient presents to [facility] seeking assistance with medication managemetn. She states that she used to go to [facility] but when she went back to establish services they said they were not taking new patients. States she is diagnosed with bipolar and has not had medication in 4 months. She denies SI/HI/AVH or any recent SA.  Patient is routine.

## 2021-01-08 NOTE — ED Provider Notes (Signed)
[facility]al [facility] Urgent [facility] Medical Screening Exam  Patient Name: Ethan Peck MRN: [id number] Date of Evaluation: 01/08/21 Chief Complaint:   Diagnosis:  Final diagnoses:  MDD (major depressive disorder), recurrent episode, moderate (HCC)  Generalized anxiety disorder    History of Present illness: Ethan Peck is a 35 y.o. male patient patient presented to [facility] as a walk in accompanied by his mother with complaints of "I need to get back on my meds for my bipolar".   Ethan Peck, 35 y.o., male patient seen face to face by this provider, consulted with Dr. Bronwen Betters; and chart reviewed on 01/08/21.  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  States with the stress , decreased sleep and racing thoughts, he is afraid his condition will worsen.  During evaluation Ethan Peck is in sitting position in no acute distress.  He makes good eye contact and is pleasant.   Speech is clear, coherent, normal rate, and tone.  He is alert, oriented x 4 and cooperative.  His mood mood is depressed with congruent affect.  States he is anxious much of the day.  States he only sleeps 3 to 4 hours per night and has decreased appetite.  Patient endorses racing thoughts, states, "it is hard for me to turn my brain off".  He does not appear to be responding to internal/external stimuli or delusional thoughts.  Patient denies suicidal/self-harm/homicidal ideation, psychosis, and paranoia.  Patient contracts for safety.  Patient answered question appropriately.    Patient states he used to follow-up with [facility] services but due to insurance reasons he can no longer be seen there.  States he used to take Celexa 40 mg daily, Vistaril 25 mg p.o. PRN TID, trazodone 50 mg PO QHS and Abilify 5 mg QD.  Patient states he called to make an appointment for outpatient services for behavioral health on the second floor.  His appointment is for therapy on 03/08/2021.  Educated  patient on medications and side effects.  Explained to patient he would be provided with a printed prescription for Abilify 5 mg PO QD, hydroxyzine 25 mg PO TID PRN, trazodone 50 mg PO QHS -30-day with no refills.  Case discussed with Dr. Bronwen Betters.  At this time Celexa will not be started due to patient stating he has a bipolar diagnosis..  Encouraged patient to discuss medication management with outpatient provider.  Patient states he will go to outpatient behavioral health services for walk-in hours to be seen for medication management.  Informed patient that refills will not be performed at this facility.  States he will have to follow-up with an outpatient provider for medication refills.  Patient denies alcohol or substance use.    Psychiatric Specialty Exam  Presentation  General Appearance:Casual  Eye Contact:Good  Speech:Clear and Coherent; Normal Rate  Speech Volume:Normal  Handedness:Right   Mood and Affect  Mood:Depressed; Anxious  Affect:Congruent   Thought Process  Thought Processes:Coherent  Descriptions of Associations:Intact  Orientation:Full (Time, Place and Person)  Thought Content:Logical    Hallucinations:None  Ideas of Reference:None  Suicidal Thoughts:No  Homicidal Thoughts:No   Sensorium  Memory:Immediate Good; Recent Good; Remote Good  Judgment:Good  Insight:Good   Executive Functions  Concentration:Good  Attention Span:Good  Recall:Good  Fund of Knowledge:Good  Language:Good   Psychomotor Activity  Psychomotor Activity:Normal   Assets  Assets:Communication Skills; Desire for Improvement; Financial Resources/Insurance; Housing; Physical Health; Resilience; Social Support   Sleep  Sleep:Poor  Number of hours: 4   No data recorded  Physical Exam: Physical Exam Vitals and nursing note reviewed.  Constitutional:      Appearance: He is well-developed.  HENT:     Head: Normocephalic and atraumatic.  Eyes:      General:        Right eye: No discharge.        Left eye: No discharge.     Conjunctiva/sclera: Conjunctivae normal.  Cardiovascular:     Rate and Rhythm: Normal rate.     Heart sounds: No murmur heard. Pulmonary:     Effort: Pulmonary effort is normal. No respiratory distress.  Musculoskeletal:        General: Normal range of motion.     Cervical back: Normal range of motion.  Skin:    General: Skin is warm and dry.  Neurological:     Mental Status: He is alert and oriented to person, place, and time.  Psychiatric:        Attention and Perception: Attention and perception normal.        Mood and Affect: Mood is anxious and depressed.        Speech: Speech normal.        Behavior: Behavior normal. Behavior is cooperative.        Thought Content: Thought content normal.        Cognition and Memory: Cognition normal.        Judgment: Judgment normal.   Review of Systems  Constitutional: Negative.   HENT: Negative.  Negative for hearing loss.   Eyes: Negative.   Respiratory: Negative.  Negative for cough.   Cardiovascular:  Negative for chest pain.  Musculoskeletal: Negative.   Skin: Negative.   Neurological: Negative.   Psychiatric/Behavioral:  Positive for depression. The patient is nervous/anxious.   Blood pressure (!) 145/95, pulse 73, temperature 98.5 F (36.9 C), temperature source Oral, resp. rate 18, SpO2 (!) 81 %. There is no height or weight on file to calculate BMI.  Musculoskeletal: Strength & Muscle Tone: within normal limits Gait & Station: normal Patient leans: N/A   [facility] MSE Discharge Disposition for Follow up and Recommendations: Based on my evaluation the patient does not appear to have an emergency medical condition and can be discharged with resources and follow up care in outpatient services for Medication Management and Individual Therapy  Discharge patient  Prescription for Abilify 5 mg PO QD, hydroxyzine 25 mg PO TID PRN, trazodone 50 mg PO QHS  -30-day written prescription with no refills.  Patient has a therapy appointment 03/08/2021 with Ethan Peck, outpatient behavioral health services on second floor.  Explained to patient open access walk-in hours for medication management.  No evidence of imminent risk to self or others at present.    Patient does not meet criteria for psychiatric inpatient admission. Discussed crisis plan, support from social network, calling 911, coming to the Emergency Department, and calling Suicide Hotline.   Ardis Hughs, NP 01/08/2021, 4:05 PM

## 2021-03-08 ENCOUNTER — Telehealth (HOSPITAL_COMMUNITY): Payer: Self-pay | Admitting: *Deleted

## 2021-03-08 ENCOUNTER — Other Ambulatory Visit (HOSPITAL_COMMUNITY): Payer: Self-pay | Admitting: Psychiatry

## 2021-03-08 ENCOUNTER — Ambulatory Visit (INDEPENDENT_AMBULATORY_CARE_PROVIDER_SITE_OTHER): Payer: 59 | Admitting: Licensed Clinical Social Worker

## 2021-03-08 DIAGNOSIS — F3130 Bipolar disorder, current episode depressed, mild or moderate severity, unspecified: Secondary | ICD-10-CM

## 2021-03-08 DIAGNOSIS — F3160 Bipolar disorder, current episode mixed, unspecified: Secondary | ICD-10-CM | POA: Diagnosis not present

## 2021-03-08 MED ORDER — CITALOPRAM HYDROBROMIDE 40 MG PO TABS: 40.0000 mg | ORAL_TABLET | Freq: Every day | ORAL | 3 refills | Status: DC

## 2021-03-08 MED ORDER — TRAZODONE HCL 50 MG PO TABS: 50.0000 mg | ORAL_TABLET | Freq: Every day | ORAL | 3 refills | Status: DC

## 2021-03-08 MED ORDER — ARIPIPRAZOLE 5 MG PO TABS: 5.0000 mg | ORAL_TABLET | Freq: Every day | ORAL | 3 refills | Status: DC

## 2021-03-08 MED ORDER — HYDROXYZINE PAMOATE 25 MG PO CAPS: 25.0000 mg | ORAL_CAPSULE | Freq: Three times a day (TID) | ORAL | 3 refills | Status: DC | PRN

## 2021-03-08 NOTE — Telephone Encounter (Signed)
Called patient to inform of his rxs having been called in and to give him his next appt date and the provider. He expressed his apprecitation.

## 2021-03-08 NOTE — Telephone Encounter (Signed)
Provider filled medication and sent it to his preferred pharmacy.

## 2021-03-08 NOTE — Telephone Encounter (Signed)
Called him to inform him of his appt and his meds were called in as discussed yesterday. When I told him what meds were at his pharmacy he said we missed his Celexa which he states he benefits from. Consulted with Dr Doyne Keel and she called it in too. Notified patient.

## 2021-03-08 NOTE — Telephone Encounter (Signed)
Call from Ethan Peck to check on his appt tomorrow and make sure it was virtual. Upset to learn it was with a therapist and not a provider. States he has been given wrong and misleading information and has been waiting two months to see a provider. He is out of medicine as of tomorrow. 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 TOld him I would discuss with provider bridging meds till he can get an appt to see a provider. Spoke with Dr Doyne Keel, she agreed to bridge him as his last rxs came from the [facility] NP. He was given an appt with a provider for his assessment and meds to be called in tomorrow after he meets with the therapist for meds till his appt.on 10/17 at 1030 with Ethan Means NP.

## 2021-03-11 NOTE — Telephone Encounter (Signed)
Medication was sent to preferred pharmacy
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
## 2021-04-08 ENCOUNTER — Ambulatory Visit (INDEPENDENT_AMBULATORY_CARE_PROVIDER_SITE_OTHER): Payer: 59 | Admitting: Psychiatry

## 2021-04-08 ENCOUNTER — Encounter (HOSPITAL_COMMUNITY): Payer: Self-pay

## 2021-04-08 ENCOUNTER — Other Ambulatory Visit: Payer: Self-pay

## 2021-04-08 VITALS — BP 140/80 | HR 73 | Ht 71.0 in | Wt 200.0 lb

## 2021-04-08 DIAGNOSIS — F411 Generalized anxiety disorder: Secondary | ICD-10-CM | POA: Diagnosis not present

## 2021-04-08 DIAGNOSIS — F331 Major depressive disorder, recurrent, moderate: Secondary | ICD-10-CM | POA: Diagnosis not present

## 2021-04-08 DIAGNOSIS — F3130 Bipolar disorder, current episode depressed, mild or moderate severity, unspecified: Secondary | ICD-10-CM

## 2021-04-08 MED ORDER — MIRTAZAPINE 15 MG PO TBDP: 15.0000 mg | ORAL_TABLET | Freq: Every day | ORAL | 2 refills | Status: DC

## 2021-04-08 MED ORDER — GABAPENTIN 100 MG PO CAPS: 300.0000 mg | ORAL_CAPSULE | Freq: Three times a day (TID) | ORAL | 2 refills | Status: AC

## 2021-04-08 MED ORDER — HYDROXYZINE PAMOATE 25 MG PO CAPS: 25.0000 mg | ORAL_CAPSULE | Freq: Three times a day (TID) | ORAL | 3 refills | Status: DC | PRN

## 2021-04-08 MED ORDER — QUETIAPINE FUMARATE 25 MG PO TABS: 25.0000 mg | ORAL_TABLET | Freq: Every day | ORAL | 2 refills | Status: DC

## 2021-04-08 MED ORDER — CITALOPRAM HYDROBROMIDE 40 MG PO TABS: 40.0000 mg | ORAL_TABLET | Freq: Every day | ORAL | 3 refills | Status: AC

## 2021-04-08 MED ORDER — ARIPIPRAZOLE 5 MG PO TABS: 5.0000 mg | ORAL_TABLET | Freq: Every day | ORAL | 3 refills | Status: AC

## 2021-04-08 NOTE — Progress Notes (Signed)
Psychiatric Initial Adult Assessment   Patient Identification: Ethan Peck MRN:  [id number] Date of Evaluation:  04/08/2021 Referral Source: self Chief Complaint:   Chief Complaint   Anxiety; Depression; Establish Care    Visit Diagnosis:    ICD-10-CM   1. Generalized anxiety disorder  F41.1     2. Major depressive disorder, recurrent episode, moderate (HCC)  F33.1       History of Present Illness:   35 yo male presents to establish care for his bipolar disorder with depression and anxiety.  His depression is high with passive, intermittent suicidal ideations at times, no plan or intent or past attempts.  High anxiety with panic attacks on occasion.  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  Trauma from the past is resurfacing with some flashbacks at times.  His sleep is poor as he worries frequently at night, Trazodone is no longer working.  Appetite is fair to poor, minimal weight loss.  No mania, psychosis, or substance abuse.  His mother has her "own mental health issues" with depression and anxiety.  He is future focused with work and his life, discussed a safety plan including walking-in to the [facility] or calling [phone] or going to the ED if his suicidal ideations increase.    Associated Signs/Symptoms: Depression Symptoms:  depressed mood, insomnia, fatigue, difficulty concentrating, suicidal thoughts without plan, (Hypo) Manic Symptoms:  Irritable Mood, Anxiety Symptoms:  Excessive Worry, Panic Symptoms, Psychotic Symptoms:   none PTSD Symptoms: Had a traumatic exposure:  past trauma with some occasional flashbacks  Past Psychiatric History: bipolar d/o, depression, anxiety  Previous Psychotropic Medications: Yes   Substance Abuse History in the last 12 months:  No.  Consequences of Substance Abuse: NA  Past Medical History:  Past  Medical History:  Diagnosis Date   Anxiety    Depression    Kidney stone     Past Surgical History:  Procedure Laterality Date   WISDOM TOOTH EXTRACTION  2007    Family Psychiatric History: see below  Family History:  Family History  Problem Relation Age of Onset   Depression Mother    Hyperlipidemia Mother    Hypertension Mother    Drug abuse Mother    Depression Brother    Drug abuse Brother     Social History:   Social History   Socioeconomic History   Marital status: Single    Spouse name: Not on file   Number of children: Not on file   Years of education: Not on file   Highest education level: Not on file  Occupational History   Not on file  Tobacco Use   Smoking status: Never   Smokeless tobacco: Never  Vaping Use   Vaping Use: Some days  Substance and Sexual Activity   Alcohol use: Not Currently   Drug use: No   Sexual activity: Not Currently  Other Topics Concern   Not on file  Social History Narrative   Not on file   Social Determinants of Health   Financial Resource Strain: Not on file  Food Insecurity: Not on file  Transportation Needs: Not on file  Physical Activity: Not on file  Stress: Not on file  Social Connections: Not on file    Additional Social History: lives alone, focused on work  Allergies:  No Known Allergies  Metabolic Disorder Labs: No results found for: HGBA1C, MPG No results  found for: PROLACTIN Lab Results  Component Value Date   CHOL 194 02/06/2010   TRIG 116 02/06/2010   HDL 37 (L) 02/06/2010   CHOLHDL 5.2 Ratio 02/06/2010   VLDL 23 02/06/2010   LDLCALC 134 (H) 02/06/2010    Therapeutic Level Labs: No results found for: LITHIUM No results found for: CBMZ No results found for: VALPROATE  Current Medications:    Musculoskeletal: Strength & Muscle Tone: within normal limits Gait & Station: normal Patient leans: N/A  Psychiatric Specialty Exam: Review of Systems  Psychiatric/Behavioral:  Positive  for dysphoric mood and sleep disturbance. The patient is nervous/anxious.   All other systems reviewed and are negative.    General Appearance: Casual  Eye Contact:  Good  Speech:  Normal Rate  Volume:  Normal  Mood:  Anxious and Depressed  Affect:  Congruent  Thought Process:  Coherent and Descriptions of Associations: Intact  Orientation:  Full (Time, Place, and Person)  Thought Content:  Rumination  Suicidal Thoughts:  Yes.  without intent/plan  Homicidal Thoughts:  No  Memory:  Immediate;   Good Recent;   Good Remote;   Good  Judgement:  Good  Insight:  Good  Psychomotor Activity:  Normal  Concentration:  Concentration: Fair and Attention Span: Fair  Recall:  Good  Fund of Knowledge:Good  Language: Good  Akathisia:  No  Handed:  Right  AIMS (if indicated):  not done  Assets:  Housing Leisure Time Physical Health Resilience Talents/Skills Transportation Vocational/Educational  ADL's:  Intact  Cognition: WNL  Sleep:  Poor   Screenings: Insurance account manager from 03/08/2021 in [facility] Office Visit from 05/09/2013 in [facility]  PHQ-2 Total Score 6 0  PHQ-9 Total Score 27 --      Flowsheet Row Counselor from 03/08/2021 in [facility]  C-SSRS RISK CATEGORY Moderate Risk       Assessment and Plan:  Bipolar affective disorder, depressed, severe: -Continue Abilify 5 mg daily -Continue Celexa 40 mg daily -Continue therapy  General anxiety disorder: -Restart gabapentin at 200-300 mg TID PRN  Insomnia: -Discontinue Trazodone -Start Remeron 7.5 mg daily at bedtime, if this does not work--then, get the Seroquel 25 mg daily at bedtime that was also ordered.  Virtual Visit via Video Note  I connected with Ileene Patrick on 04/08/21 at 10:30 AM EDT by a video enabled telemedicine application and verified that I am speaking with the correct person using two  identifiers.  Location: Patient: work Provider: home office   I discussed the limitations of evaluation and management by telemedicine and the availability of in person appointments. The patient expressed understanding and agreed to proceed.  Follow Up Instructions: Follow up in 2 weeks   I discussed the assessment and treatment plan with the patient. The patient was provided an opportunity to ask questions and all were answered. The patient agreed with the plan and demonstrated an understanding of the instructions.   The patient was advised to call back or seek an in-person evaluation if the symptoms worsen or if the condition fails to improve as anticipated.  I provided 60 minutes of non-face-to-face time during this encounter.   Nanine Means, NP   Nanine Means, NP 10/17/202210:37 AM

## 2021-04-29 ENCOUNTER — Telehealth (INDEPENDENT_AMBULATORY_CARE_PROVIDER_SITE_OTHER): Payer: 59 | Admitting: Psychiatry

## 2021-04-29 ENCOUNTER — Other Ambulatory Visit: Payer: Self-pay

## 2021-04-29 ENCOUNTER — Encounter (HOSPITAL_COMMUNITY): Payer: Self-pay

## 2021-04-29 DIAGNOSIS — F331 Major depressive disorder, recurrent, moderate: Secondary | ICD-10-CM | POA: Diagnosis not present

## 2021-04-29 DIAGNOSIS — F411 Generalized anxiety disorder: Secondary | ICD-10-CM

## 2021-04-29 MED ORDER — QUETIAPINE FUMARATE 25 MG PO TABS: 25.0000 mg | ORAL_TABLET | Freq: Every day | ORAL | 2 refills | Status: AC

## 2021-04-29 NOTE — Progress Notes (Signed)
BH NP OP Progress Note  Patient Identification: Ethan Peck MRN:  [id number] Date of Evaluation:  04/29/2021 Referral Source: self Chief Complaint:   Chief Complaint   Anxiety; Follow-up; Depression    Visit Diagnosis:    ICD-10-CM   1. Major depressive disorder, recurrent episode, moderate (HCC)  F33.1     2. Generalized anxiety disorder  F41.1       History of Present Illness:   35 yo male presents to establish care for his bipolar disorder with depression and anxiety.  The Remeron did not work for sleep and he continues to struggle with sleep initiation and maintenance.  Discussed options and decided to start Seroquel to assist. Moderate depression related to poor sleep and stress at work, no suicidal ideations.  Moderate to high level of anxiety with no panic attacks.  Denies side effects from his other medications.  Appetite is steady.  He did start therapy and encouragement provided to deal with past traumas.    Past Psychiatric History: bipolar d/o, depression, anxiety  Previous Psychotropic Medications: Yes   Substance Abuse History in the last 12 months:  No.  Consequences of Substance Abuse: NA  Past Medical History:  Past Medical History:  Diagnosis Date   Anxiety    Depression    Kidney stone     Past Surgical History:  Procedure Laterality Date   WISDOM TOOTH EXTRACTION  2007    Family Psychiatric History: see below  Family History:  Family History  Problem Relation Age of Onset   Depression Mother    Hyperlipidemia Mother    Hypertension Mother    Drug abuse Mother    Depression Brother    Drug abuse Brother     Social History:   Social History   Socioeconomic History   Marital status: Single    Spouse name: Not on file   Number of children: Not on file   Years of education: Not on file   Highest education level: Not on file  Occupational History   Not on file  Tobacco Use   Smoking status: Never   Smokeless tobacco: Never   Vaping Use   Vaping Use: Some days  Substance and Sexual Activity   Alcohol use: Not Currently   Drug use: No   Sexual activity: Not Currently  Other Topics Concern   Not on file  Social History Narrative   Not on file   Social Determinants of Health   Financial Resource Strain: Not on file  Food Insecurity: Not on file  Transportation Needs: Not on file  Physical Activity: Not on file  Stress: Not on file  Social Connections: Not on file    Additional Social History: lives alone, focused on work  Allergies:  No Known Allergies  Metabolic Disorder Labs: No results found for: HGBA1C, MPG No results found for: PROLACTIN Lab Results  Component Value Date   CHOL 194 02/06/2010   TRIG 116 02/06/2010   HDL 37 (L) 02/06/2010   CHOLHDL 5.2 Ratio 02/06/2010   VLDL 23 02/06/2010   LDLCALC 134 (H) 02/06/2010    Therapeutic Level Labs: No results found for: LITHIUM No results found for: CBMZ No results found for: VALPROATE  Current Medications:    Musculoskeletal: Strength & Muscle Tone: within normal limits Gait & Station: normal Patient leans: N/A  Psychiatric Specialty Exam: Review of Systems  Psychiatric/Behavioral:  Positive for dysphoric mood and sleep disturbance. The patient is nervous/anxious.   All other systems reviewed and are  negative.    General Appearance: Casual  Eye Contact:  Good  Speech:  Normal Rate  Volume:  Normal  Mood:  Anxious and Depressed  Affect:  Congruent  Thought Process:  Coherent and Descriptions of Associations: Intact  Orientation:  Full (Time, Place, and Person)  Thought Content:  Rumination  Suicidal Thoughts:  Yes.  without intent/plan  Homicidal Thoughts:  No  Memory:  Immediate;   Good Recent;   Good Remote;   Good  Judgement:  Good  Insight:  Good  Psychomotor Activity:  Normal  Concentration:  Concentration: Fair and Attention Span: Fair  Recall:  Good  Fund of Knowledge:Good  Language: Good  Akathisia:  No   Handed:  Right  AIMS (if indicated):  not done  Assets:  Housing Leisure Time Physical Health Resilience Talents/Skills Transportation Vocational/Educational  ADL's:  Intact  Cognition: WNL  Sleep:  Poor   Screenings: Equities trader Office Visit from 04/08/2021 in [facility] Counselor from 03/08/2021 in [facility] Office Visit from 05/09/2013 in [facility]  PHQ-2 Total Score 2 6 0  PHQ-9 Total Score 9 27 --      Flowsheet Row Office Visit from 04/08/2021 in [facility] Counselor from 03/08/2021 in [facility]  C-SSRS RISK CATEGORY Low Risk Moderate Risk       Assessment and Plan:  Bipolar affective disorder, depressed, severe: -Continue Abilify 5 mg daily -Continue Celexa 40 mg daily -Continue therapy  General anxiety disorder: -Continue gabapentin at 200-300 mg TID PRN  Insomnia: -Discontinue Remeron 7.5 mg daily at bedtime, if this does not work -Started 12.5 -25 mg of Seroquel  Virtual Visit via Video Note  I connected with Ethan Peck on 04/29/21 at  9:00 AM EST by a video enabled telemedicine application and verified that I am speaking with the correct person using two identifiers.  Location: Patient: home Provider: home office   I discussed the limitations of evaluation and management by telemedicine and the availability of in person appointments. The patient expressed understanding and agreed to proceed.  Follow Up Instructions: Follow up in 1 week   I discussed the assessment and treatment plan with the patient. The patient was provided an opportunity to ask questions and all were answered. The patient agreed with the plan and demonstrated an understanding of the instructions.   The patient was advised to call back or seek an in-person evaluation if the symptoms worsen or if the condition fails to  improve as anticipated.  I provided 20 minutes of non-face-to-face time during this encounter.   Nanine Means, NP   Nanine Means, NP 11/7/20229:10 AM

## 2021-05-06 ENCOUNTER — Encounter (HOSPITAL_COMMUNITY): Payer: 59 | Admitting: Psychiatry

## 2021-05-13 ENCOUNTER — Telehealth (HOSPITAL_COMMUNITY): Payer: 59

## 2021-05-21 ENCOUNTER — Telehealth (HOSPITAL_COMMUNITY): Payer: Self-pay | Admitting: Licensed Clinical Social Worker

## 2021-05-21 ENCOUNTER — Ambulatory Visit (HOSPITAL_COMMUNITY): Payer: 59 | Admitting: Licensed Clinical Social Worker

## 2021-05-21 NOTE — Telephone Encounter (Signed)
LCSW sent text message with link for video session per schedule. LCSW remained avail for appt online until 8:15am. Pt failed to sign on for session.

## 2021-06-12 ENCOUNTER — Telehealth (HOSPITAL_COMMUNITY): Payer: Self-pay | Admitting: Licensed Clinical Social Worker

## 2021-06-12 ENCOUNTER — Ambulatory Visit (HOSPITAL_COMMUNITY): Payer: 59 | Admitting: Licensed Clinical Social Worker

## 2021-06-12 NOTE — Telephone Encounter (Signed)
LCSW sent text message with link for video session per schedule. LCSW remained avail online for session until 9:12am. Pt failed to sign on for session.

## 2021-07-23 NOTE — Progress Notes (Signed)
This encounter was created in error - please disregard. This encounter was created in error - please disregard. This encounter was created in error - please disregard. 

## 2022-05-23 DIAGNOSIS — Z419 Encounter for procedure for purposes other than remedying health state, unspecified: Secondary | ICD-10-CM | POA: Diagnosis not present

## 2022-06-23 DIAGNOSIS — Z419 Encounter for procedure for purposes other than remedying health state, unspecified: Secondary | ICD-10-CM | POA: Diagnosis not present

## 2022-07-24 DIAGNOSIS — Z419 Encounter for procedure for purposes other than remedying health state, unspecified: Secondary | ICD-10-CM | POA: Diagnosis not present

## 2022-08-07 ENCOUNTER — Telehealth: Payer: Self-pay

## 2022-08-07 NOTE — Telephone Encounter (Signed)
Mychart msg sent. AS, CMA

## 2022-08-22 DIAGNOSIS — Z419 Encounter for procedure for purposes other than remedying health state, unspecified: Secondary | ICD-10-CM | POA: Diagnosis not present

## 2022-09-22 DIAGNOSIS — Z419 Encounter for procedure for purposes other than remedying health state, unspecified: Secondary | ICD-10-CM | POA: Diagnosis not present

## 2022-10-22 DIAGNOSIS — Z419 Encounter for procedure for purposes other than remedying health state, unspecified: Secondary | ICD-10-CM | POA: Diagnosis not present

## 2022-11-22 DIAGNOSIS — Z419 Encounter for procedure for purposes other than remedying health state, unspecified: Secondary | ICD-10-CM | POA: Diagnosis not present

## 2022-12-22 DIAGNOSIS — Z419 Encounter for procedure for purposes other than remedying health state, unspecified: Secondary | ICD-10-CM | POA: Diagnosis not present

## 2023-01-22 DIAGNOSIS — Z419 Encounter for procedure for purposes other than remedying health state, unspecified: Secondary | ICD-10-CM | POA: Diagnosis not present

## 2023-02-22 DIAGNOSIS — Z419 Encounter for procedure for purposes other than remedying health state, unspecified: Secondary | ICD-10-CM | POA: Diagnosis not present

## 2023-03-04 DIAGNOSIS — F649 Gender identity disorder, unspecified: Secondary | ICD-10-CM | POA: Diagnosis not present

## 2023-03-24 DIAGNOSIS — Z419 Encounter for procedure for purposes other than remedying health state, unspecified: Secondary | ICD-10-CM | POA: Diagnosis not present

## 2023-04-24 DIAGNOSIS — Z419 Encounter for procedure for purposes other than remedying health state, unspecified: Secondary | ICD-10-CM | POA: Diagnosis not present

## 2023-05-24 DIAGNOSIS — Z419 Encounter for procedure for purposes other than remedying health state, unspecified: Secondary | ICD-10-CM | POA: Diagnosis not present

## 2023-06-03 DIAGNOSIS — F649 Gender identity disorder, unspecified: Secondary | ICD-10-CM | POA: Diagnosis not present

## 2023-06-24 DIAGNOSIS — Z419 Encounter for procedure for purposes other than remedying health state, unspecified: Secondary | ICD-10-CM | POA: Diagnosis not present

## 2023-07-25 DIAGNOSIS — Z419 Encounter for procedure for purposes other than remedying health state, unspecified: Secondary | ICD-10-CM | POA: Diagnosis not present

## 2023-08-22 DIAGNOSIS — Z419 Encounter for procedure for purposes other than remedying health state, unspecified: Secondary | ICD-10-CM | POA: Diagnosis not present

## 2023-09-02 DIAGNOSIS — F649 Gender identity disorder, unspecified: Secondary | ICD-10-CM | POA: Diagnosis not present

## 2023-10-03 DIAGNOSIS — Z419 Encounter for procedure for purposes other than remedying health state, unspecified: Secondary | ICD-10-CM | POA: Diagnosis not present

## 2023-10-14 DIAGNOSIS — N390 Urinary tract infection, site not specified: Secondary | ICD-10-CM | POA: Diagnosis not present

## 2023-10-14 DIAGNOSIS — R300 Dysuria: Secondary | ICD-10-CM | POA: Diagnosis not present

## 2023-10-15 DIAGNOSIS — Z5321 Procedure and treatment not carried out due to patient leaving prior to being seen by health care provider: Secondary | ICD-10-CM | POA: Diagnosis not present

## 2023-10-15 DIAGNOSIS — R319 Hematuria, unspecified: Secondary | ICD-10-CM | POA: Diagnosis not present

## 2023-11-02 DIAGNOSIS — Z419 Encounter for procedure for purposes other than remedying health state, unspecified: Secondary | ICD-10-CM | POA: Diagnosis not present

## 2023-12-03 DIAGNOSIS — F649 Gender identity disorder, unspecified: Secondary | ICD-10-CM | POA: Diagnosis not present

## 2023-12-03 DIAGNOSIS — Z419 Encounter for procedure for purposes other than remedying health state, unspecified: Secondary | ICD-10-CM | POA: Diagnosis not present

## 2023-12-03 DIAGNOSIS — F319 Bipolar disorder, unspecified: Secondary | ICD-10-CM | POA: Diagnosis not present

## 2023-12-18 DIAGNOSIS — F319 Bipolar disorder, unspecified: Secondary | ICD-10-CM | POA: Diagnosis not present

## 2023-12-30 DIAGNOSIS — F4310 Post-traumatic stress disorder, unspecified: Secondary | ICD-10-CM | POA: Diagnosis not present

## 2023-12-30 DIAGNOSIS — F319 Bipolar disorder, unspecified: Secondary | ICD-10-CM | POA: Diagnosis not present

## 2023-12-31 DIAGNOSIS — F319 Bipolar disorder, unspecified: Secondary | ICD-10-CM | POA: Diagnosis not present

## 2024-01-02 DIAGNOSIS — Z419 Encounter for procedure for purposes other than remedying health state, unspecified: Secondary | ICD-10-CM | POA: Diagnosis not present

## 2024-01-02 DIAGNOSIS — R6884 Jaw pain: Secondary | ICD-10-CM | POA: Diagnosis not present

## 2024-01-08 DIAGNOSIS — K029 Dental caries, unspecified: Secondary | ICD-10-CM | POA: Diagnosis not present

## 2024-01-08 DIAGNOSIS — R6884 Jaw pain: Secondary | ICD-10-CM | POA: Diagnosis not present

## 2024-01-08 DIAGNOSIS — F319 Bipolar disorder, unspecified: Secondary | ICD-10-CM | POA: Diagnosis not present

## 2024-01-15 DIAGNOSIS — F319 Bipolar disorder, unspecified: Secondary | ICD-10-CM | POA: Diagnosis not present

## 2024-01-26 DIAGNOSIS — F319 Bipolar disorder, unspecified: Secondary | ICD-10-CM | POA: Diagnosis not present

## 2024-01-26 DIAGNOSIS — F4310 Post-traumatic stress disorder, unspecified: Secondary | ICD-10-CM | POA: Diagnosis not present

## 2024-01-29 DIAGNOSIS — F319 Bipolar disorder, unspecified: Secondary | ICD-10-CM | POA: Diagnosis not present

## 2024-02-02 DIAGNOSIS — Z419 Encounter for procedure for purposes other than remedying health state, unspecified: Secondary | ICD-10-CM | POA: Diagnosis not present

## 2024-02-23 DIAGNOSIS — F319 Bipolar disorder, unspecified: Secondary | ICD-10-CM | POA: Diagnosis not present

## 2024-02-23 DIAGNOSIS — F4310 Post-traumatic stress disorder, unspecified: Secondary | ICD-10-CM | POA: Diagnosis not present

## 2024-03-04 DIAGNOSIS — Z419 Encounter for procedure for purposes other than remedying health state, unspecified: Secondary | ICD-10-CM | POA: Diagnosis not present

## 2024-03-21 DIAGNOSIS — F319 Bipolar disorder, unspecified: Secondary | ICD-10-CM | POA: Diagnosis not present

## 2024-04-07 DIAGNOSIS — F319 Bipolar disorder, unspecified: Secondary | ICD-10-CM | POA: Diagnosis not present

## 2024-04-18 DIAGNOSIS — F319 Bipolar disorder, unspecified: Secondary | ICD-10-CM | POA: Diagnosis not present

## 2024-04-18 DIAGNOSIS — F4310 Post-traumatic stress disorder, unspecified: Secondary | ICD-10-CM | POA: Diagnosis not present

## 2024-05-04 DIAGNOSIS — Z419 Encounter for procedure for purposes other than remedying health state, unspecified: Secondary | ICD-10-CM | POA: Diagnosis not present

## 2024-05-23 DIAGNOSIS — F319 Bipolar disorder, unspecified: Secondary | ICD-10-CM | POA: Diagnosis not present

## 2024-06-02 DIAGNOSIS — E349 Endocrine disorder, unspecified: Secondary | ICD-10-CM | POA: Diagnosis not present

## 2024-06-02 DIAGNOSIS — Z5181 Encounter for therapeutic drug level monitoring: Secondary | ICD-10-CM | POA: Diagnosis not present

## 2024-06-02 DIAGNOSIS — Z789 Other specified health status: Secondary | ICD-10-CM | POA: Diagnosis not present

## 2024-06-14 DIAGNOSIS — F319 Bipolar disorder, unspecified: Secondary | ICD-10-CM | POA: Diagnosis not present

## 2024-06-14 DIAGNOSIS — F4310 Post-traumatic stress disorder, unspecified: Secondary | ICD-10-CM | POA: Diagnosis not present
